# Patient Record
Sex: Female | Born: 1975
Health system: Southern US, Community
[De-identification: ages and names within clinical notes are randomized; demographics above are authoritative.]

## PROBLEM LIST (undated history)

## (undated) DIAGNOSIS — IMO0002 Reserved for concepts with insufficient information to code with codable children: Secondary | ICD-10-CM

## (undated) DIAGNOSIS — G43909 Migraine, unspecified, not intractable, without status migrainosus: Secondary | ICD-10-CM

## (undated) DIAGNOSIS — R51 Headache: Secondary | ICD-10-CM

## (undated) DIAGNOSIS — G479 Sleep disorder, unspecified: Secondary | ICD-10-CM

## (undated) HISTORY — DX: Reserved for concepts with insufficient information to code with codable children: IMO0002

## (undated) HISTORY — DX: Migraine, unspecified, not intractable, without status migrainosus: G43.909

## (undated) HISTORY — PX: DILATION AND CURETTAGE OF UTERUS: SHX78

## (undated) HISTORY — PX: LIPOSUCTION: SHX10

---

## 1998-01-28 ENCOUNTER — Inpatient Hospital Stay (HOSPITAL_COMMUNITY): Admission: AD | Admit: 1998-01-28 | Discharge: 1998-01-28 | Payer: Self-pay | Admitting: Obstetrics and Gynecology

## 1998-01-31 ENCOUNTER — Inpatient Hospital Stay (HOSPITAL_COMMUNITY): Admission: AD | Admit: 1998-01-31 | Discharge: 1998-02-03 | Payer: Self-pay | Admitting: Obstetrics and Gynecology

## 2002-11-13 DIAGNOSIS — IMO0002 Reserved for concepts with insufficient information to code with codable children: Secondary | ICD-10-CM

## 2002-11-13 DIAGNOSIS — R87619 Unspecified abnormal cytological findings in specimens from cervix uteri: Secondary | ICD-10-CM

## 2002-11-13 HISTORY — DX: Unspecified abnormal cytological findings in specimens from cervix uteri: R87.619

## 2002-11-13 HISTORY — DX: Reserved for concepts with insufficient information to code with codable children: IMO0002

## 2002-11-13 HISTORY — PX: CERVICAL BIOPSY  W/ LOOP ELECTRODE EXCISION: SUR135

## 2006-07-26 ENCOUNTER — Ambulatory Visit: Payer: Self-pay | Admitting: Family Medicine

## 2006-12-20 ENCOUNTER — Ambulatory Visit: Payer: Self-pay | Admitting: Family Medicine

## 2008-02-11 ENCOUNTER — Other Ambulatory Visit: Admission: RE | Admit: 2008-02-11 | Discharge: 2008-02-11 | Payer: Self-pay | Admitting: Obstetrics and Gynecology

## 2009-02-11 ENCOUNTER — Other Ambulatory Visit: Admission: RE | Admit: 2009-02-11 | Discharge: 2009-02-11 | Payer: Self-pay | Admitting: Obstetrics & Gynecology

## 2011-10-18 ENCOUNTER — Inpatient Hospital Stay (HOSPITAL_COMMUNITY)
Admission: AD | Admit: 2011-10-18 | Discharge: 2011-10-19 | Disposition: A | Discharge status: Home / Self Care | Payer: BC Managed Care – PPO | Source: Ambulatory Visit | Attending: Obstetrics and Gynecology | Admitting: Obstetrics and Gynecology

## 2011-10-18 DIAGNOSIS — M549 Dorsalgia, unspecified: Secondary | ICD-10-CM | POA: Insufficient documentation

## 2011-10-18 DIAGNOSIS — R1013 Epigastric pain: Secondary | ICD-10-CM | POA: Insufficient documentation

## 2011-10-18 DIAGNOSIS — R109 Unspecified abdominal pain: Secondary | ICD-10-CM

## 2011-10-18 DIAGNOSIS — D72829 Elevated white blood cell count, unspecified: Secondary | ICD-10-CM | POA: Insufficient documentation

## 2011-10-18 LAB — URINALYSIS, ROUTINE W REFLEX MICROSCOPIC
Glucose, UA: NEGATIVE mg/dL
Ketones, ur: 15 mg/dL — AB
Leukocytes, UA: NEGATIVE
Protein, ur: NEGATIVE mg/dL
Urobilinogen, UA: 0.2 mg/dL (ref 0.0–1.0)

## 2011-10-18 LAB — DIFFERENTIAL
Basophils Absolute: 0 10*3/uL (ref 0.0–0.1)
Basophils Relative: 0 % (ref 0–1)
Eosinophils Relative: 0 % (ref 0–5)
Monocytes Absolute: 0.5 10*3/uL (ref 0.1–1.0)
Neutro Abs: 12.8 10*3/uL — ABNORMAL HIGH (ref 1.7–7.7)

## 2011-10-18 LAB — CBC
HCT: 36.1 % (ref 36.0–46.0)
MCHC: 33.5 g/dL (ref 30.0–36.0)
MCV: 86.2 fL (ref 78.0–100.0)
RDW: 13.8 % (ref 11.5–15.5)

## 2011-10-18 MED ORDER — GI COCKTAIL ~~LOC~~
30.0000 mL | Freq: Once | ORAL | Status: AC
  Administered 2011-10-18: 30 mL via ORAL
  Filled 2011-10-18: qty 30

## 2011-10-18 MED ORDER — KETOROLAC TROMETHAMINE 30 MG/ML IJ SOLN
30.0000 mg | Freq: Once | INTRAMUSCULAR | Status: AC
  Administered 2011-10-18: 30 mg via INTRAMUSCULAR
  Filled 2011-10-18: qty 1

## 2011-10-18 NOTE — Progress Notes (Signed)
Pt reports "severe stomach and back pain" since last pm, constant. Denies dysuria. LMP  11/15. Nausea but no vomiting, denies fever.

## 2011-10-18 NOTE — ED Provider Notes (Signed)
Phyllis Morales y.o.No obstetric history on file. @Unknown  Chief Complaint  Patient presents with  . Back Pain    SUBJECTIVE  HPI: Onset last evening about 2 hours after eating of sharp epigastric pain radiating to mid back. The pain kept her up all night. Last ate at 11:30 AM and began having severe abdominal pain throughout her abdomen about 2 hours after eating. She can't localize the pain but describes it as sharp and constant and is radiating to her back. She has tried Advil with no relief. She's had some nausea but no vomiting. Denies fever, diarrhea, constipation. No particular food intolerances in the past.  No past medical history on file. No past surgical history on file. History   Social History  . Marital Status: Married    Spouse Name: N/A    Number of Children: N/A  . Years of Education: N/A   Occupational History  . Not on file.   Social History Main Topics  . Smoking status: Not on file  . Smokeless tobacco: Not on file  . Alcohol Use: Not on file  . Drug Use: Not on file  . Sexually Active: Not on file   Other Topics Concern  . Not on file   Social History Narrative  . No narrative on file   No current facility-administered medications on file prior to encounter.   No current outpatient prescriptions on file prior to encounter.   No Known Allergies  Review of Systems  Constitutional: Negative for fever, chills and diaphoresis.  Respiratory: Negative for cough.   Cardiovascular: Negative for chest pain and palpitations.  Gastrointestinal: Positive for nausea and abdominal pain. Negative for vomiting, diarrhea and constipation.  Genitourinary: Negative for dysuria, urgency, frequency, hematuria and flank pain.  Musculoskeletal: Positive for back pain.  Neurological: Negative for weakness and headaches.     OBJECTIVE  BP 126/75  Pulse 53  Temp 98.9 F (37.2 C)  Resp 18  Ht 5' (1.524 m)  Wt 63.504 kg (140 lb)  BMI 27.34 kg/m2  SpO2 99%   LMP 09/28/2011  Physical Exam  Constitutional: She is oriented to person, place, and time. She appears distressed.       Apparent pain  HENT:  Head: Normocephalic.  Neck: Neck supple.  Cardiovascular: Normal rate and regular rhythm.   Pulmonary/Chest: Effort normal and breath sounds normal. She has no wheezes.  Abdominal: Soft. Bowel sounds are normal. She exhibits no distension. There is tenderness. There is no rebound and no guarding.       Bowel sounds active. Murphy sign equivocal. No localized tenderness at McBurney's point. Abdomen is diffusely tender but tenderness is mild to moderate  Musculoskeletal: Normal range of motion.  Neurological: She is alert and oriented to person, place, and time.  Skin: Skin is warm and dry.  Psychiatric: Affect normal.   Results for orders placed during the hospital encounter of 10/18/11 (from the past 24 hour(s))  URINALYSIS, ROUTINE W REFLEX MICROSCOPIC     Status: Abnormal   Collection Time   10/18/11  8:50 PM      Component Value Range   Color, Urine YELLOW  YELLOW    APPearance CLEAR  CLEAR    Specific Gravity, Urine 1.020  1.005 - 1.030    pH 6.0  5.0 - 8.0    Glucose, UA NEGATIVE  NEGATIVE (mg/dL)   Hgb urine dipstick NEGATIVE  NEGATIVE    Bilirubin Urine NEGATIVE  NEGATIVE    Ketones, ur 15 (*)  NEGATIVE (mg/dL)   Protein, ur NEGATIVE  NEGATIVE (mg/dL)   Urobilinogen, UA 0.2  0.0 - 1.0 (mg/dL)   Nitrite NEGATIVE  NEGATIVE    Leukocytes, UA NEGATIVE  NEGATIVE   CBC     Status: Abnormal   Collection Time   10/18/11 10:37 PM      Component Value Range   WBC 14.5 (*) 4.0 - 10.5 (K/uL)   RBC 4.19  3.87 - 5.11 (MIL/uL)   Hemoglobin 12.1  12.0 - 15.0 (g/dL)   HCT 16.1  09.6 - 04.5 (%)   MCV 86.2  78.0 - 100.0 (fL)   MCH 28.9  26.0 - 34.0 (pg)   MCHC 33.5  30.0 - 36.0 (g/dL)   RDW 40.9  81.1 - 91.4 (%)   Platelets 236  150 - 400 (K/uL)  DIFFERENTIAL     Status: Abnormal   Collection Time   10/18/11 10:37 PM      Component Value  Range   Neutrophils Relative 89 (*) 43 - 77 (%)   Neutro Abs 12.8 (*) 1.7 - 7.7 (K/uL)   Lymphocytes Relative 7 (*) 12 - 46 (%)   Lymphs Abs 1.0  0.7 - 4.0 (K/uL)   Monocytes Relative 4  3 - 12 (%)   Monocytes Absolute 0.5  0.1 - 1.0 (K/uL)   Eosinophils Relative 0  0 - 5 (%)   Eosinophils Absolute 0.0  0.0 - 0.7 (K/uL)   Basophils Relative 0  0 - 1 (%)   Basophils Absolute 0.0  0.0 - 0.1 (K/uL)  AMYLASE     Status: Normal   Collection Time   10/18/11 10:37 PM      Component Value Range   Amylase 86  0 - 105 (U/L)  LIPASE, BLOOD     Status: Normal   Collection Time   10/18/11 10:37 PM      Component Value Range   Lipase 23  11 - 59 (U/L)   MAU Course: after the Toradol shot her pain went from 9/10 to 3/10. Then a GI cocktail had no effect. Before drinking that she vomited once CMP pending   MDM: suspect esophageal spasm due to relationship to eating, however with elevated white count, left shift and nausea/vomiting cannot yet rule out appendicitis or cholecystitis  ASSESSMENT  Abdominal pain and leukocytosis.   PLAN Patient elected to leave due to childcare issue before lab results were back. She will followup with Dr. Prince Rome tomorrow.  Consulted Dr. Despina Hidden

## 2011-10-19 ENCOUNTER — Other Ambulatory Visit: Payer: Self-pay | Admitting: Family Medicine

## 2011-10-19 LAB — COMPREHENSIVE METABOLIC PANEL
BUN: 6 mg/dL (ref 6–23)
Calcium: 9.5 mg/dL (ref 8.4–10.5)
GFR calc Af Amer: >90 mL/min (ref >90)
Glucose, Bld: 107 mg/dL — ABNORMAL HIGH (ref 70–99)
Total Protein: 7.2 g/dL (ref 6.0–8.3)

## 2011-10-19 NOTE — Progress Notes (Signed)
Pt deferred v/s because she desires to leave now.  Pt agitated because of long wait.

## 2011-10-23 ENCOUNTER — Ambulatory Visit
Admission: RE | Admit: 2011-10-23 | Discharge: 2011-10-23 | Disposition: A | Discharge status: Home / Self Care | Payer: BC Managed Care – PPO | Source: Ambulatory Visit | Attending: Family Medicine | Admitting: Family Medicine

## 2011-11-10 ENCOUNTER — Other Ambulatory Visit: Payer: Self-pay | Admitting: Family Medicine

## 2011-11-13 ENCOUNTER — Ambulatory Visit
Admission: RE | Admit: 2011-11-13 | Discharge: 2011-11-13 | Disposition: A | Discharge status: Home / Self Care | Payer: BC Managed Care – PPO | Source: Ambulatory Visit | Attending: Family Medicine | Admitting: Family Medicine

## 2011-11-13 MED ORDER — IOHEXOL 300 MG/ML  SOLN
100.0000 mL | Freq: Once | INTRAMUSCULAR | Status: AC | PRN
  Administered 2011-11-13: 100 mL via INTRAVENOUS

## 2012-04-17 ENCOUNTER — Ambulatory Visit (HOSPITAL_COMMUNITY)
Admission: EM | Admit: 2012-04-17 | Discharge: 2012-04-18 | Disposition: A | Discharge status: Home / Self Care | Payer: BC Managed Care – PPO | Attending: General Surgery | Admitting: General Surgery

## 2012-04-17 ENCOUNTER — Encounter (HOSPITAL_COMMUNITY): Payer: Self-pay

## 2012-04-17 ENCOUNTER — Encounter (HOSPITAL_COMMUNITY): Admission: EM | Disposition: A | Discharge status: Home / Self Care | Payer: Self-pay | Source: Home / Self Care

## 2012-04-17 ENCOUNTER — Inpatient Hospital Stay (HOSPITAL_COMMUNITY): Payer: BC Managed Care – PPO | Admitting: Anesthesiology

## 2012-04-17 ENCOUNTER — Inpatient Hospital Stay (HOSPITAL_COMMUNITY): Payer: BC Managed Care – PPO

## 2012-04-17 ENCOUNTER — Encounter (HOSPITAL_COMMUNITY): Payer: Self-pay | Admitting: General Practice

## 2012-04-17 ENCOUNTER — Encounter (HOSPITAL_COMMUNITY): Payer: Self-pay | Admitting: Anesthesiology

## 2012-04-17 ENCOUNTER — Emergency Department (HOSPITAL_COMMUNITY): Payer: BC Managed Care – PPO

## 2012-04-17 DIAGNOSIS — K8010 Calculus of gallbladder with chronic cholecystitis without obstruction: Principal | ICD-10-CM | POA: Insufficient documentation

## 2012-04-17 DIAGNOSIS — K8020 Calculus of gallbladder without cholecystitis without obstruction: Secondary | ICD-10-CM

## 2012-04-17 DIAGNOSIS — R51 Headache: Secondary | ICD-10-CM

## 2012-04-17 HISTORY — DX: Headache: R51

## 2012-04-17 HISTORY — PX: CHOLECYSTECTOMY: SHX55

## 2012-04-17 HISTORY — DX: Sleep disorder, unspecified: G47.9

## 2012-04-17 LAB — COMPREHENSIVE METABOLIC PANEL
ALT: 10 U/L (ref 0–35)
AST: 14 U/L (ref 0–37)
Albumin: 3.5 g/dL (ref 3.5–5.2)
Alkaline Phosphatase: 42 U/L (ref 39–117)
BUN: 10 mg/dL (ref 6–23)
CO2: 22 mEq/L (ref 19–32)
Calcium: 9 mg/dL (ref 8.4–10.5)
Chloride: 101 mEq/L (ref 96–112)
Creatinine, Ser: 0.71 mg/dL (ref 0.50–1.10)
GFR calc Af Amer: >90 mL/min (ref >90)
GFR calc non Af Amer: >90 mL/min (ref >90)
Glucose, Bld: 145 mg/dL — ABNORMAL HIGH (ref 70–99)
Potassium: 3.7 mEq/L (ref 3.5–5.1)
Sodium: 134 mEq/L — ABNORMAL LOW (ref 135–145)
Total Bilirubin: 0.3 mg/dL (ref 0.3–1.2)
Total Protein: 7.1 g/dL (ref 6.0–8.3)

## 2012-04-17 LAB — CBC
HCT: 37.1 % (ref 36.0–46.0)
Hemoglobin: 13.1 g/dL (ref 12.0–15.0)
MCH: 30.9 pg (ref 26.0–34.0)
MCHC: 35.3 g/dL (ref 30.0–36.0)
MCV: 87.5 fL (ref 78.0–100.0)
Platelets: 263 10*3/uL (ref 150–400)
RBC: 4.24 MIL/uL (ref 3.87–5.11)
RDW: 13.4 % (ref 11.5–15.5)
WBC: 13.5 10*3/uL — ABNORMAL HIGH (ref 4.0–10.5)

## 2012-04-17 LAB — PREGNANCY, URINE: Preg Test, Ur: NEGATIVE

## 2012-04-17 LAB — URINALYSIS, ROUTINE W REFLEX MICROSCOPIC
Bilirubin Urine: NEGATIVE
Glucose, UA: NEGATIVE mg/dL
Hgb urine dipstick: NEGATIVE
Ketones, ur: 15 mg/dL — AB
Leukocytes, UA: NEGATIVE
Nitrite: NEGATIVE
Protein, ur: NEGATIVE mg/dL
Specific Gravity, Urine: 1.022 (ref 1.005–1.030)
Urobilinogen, UA: 0.2 mg/dL (ref 0.0–1.0)
pH: 7.5 (ref 5.0–8.0)

## 2012-04-17 LAB — DIFFERENTIAL
Basophils Absolute: 0 10*3/uL (ref 0.0–0.1)
Basophils Relative: 0 % (ref 0–1)
Eosinophils Absolute: 0 10*3/uL (ref 0.0–0.7)
Eosinophils Relative: 0 % (ref 0–5)
Lymphocytes Relative: 7 % — ABNORMAL LOW (ref 12–46)
Lymphs Abs: 1 10*3/uL (ref 0.7–4.0)
Monocytes Absolute: 0.3 10*3/uL (ref 0.1–1.0)
Monocytes Relative: 3 % (ref 3–12)
Neutro Abs: 12.2 10*3/uL — ABNORMAL HIGH (ref 1.7–7.7)
Neutrophils Relative %: 90 % — ABNORMAL HIGH (ref 43–77)

## 2012-04-17 LAB — LIPASE, BLOOD: Lipase: 24 U/L (ref 11–59)

## 2012-04-17 SURGERY — LAPAROSCOPIC CHOLECYSTECTOMY WITH INTRAOPERATIVE CHOLANGIOGRAM
Anesthesia: General | Site: Abdomen | Wound class: Contaminated

## 2012-04-17 MED ORDER — ONDANSETRON HCL 4 MG/2ML IJ SOLN
INTRAMUSCULAR | Status: AC
  Filled 2012-04-17: qty 2

## 2012-04-17 MED ORDER — AMPICILLIN-SULBACTAM SODIUM 3 (2-1) G IJ SOLR
3.0000 g | Freq: Once | INTRAMUSCULAR | Status: AC
  Administered 2012-04-17 (×2): 3 g via INTRAVENOUS
  Filled 2012-04-17: qty 3

## 2012-04-17 MED ORDER — PIPERACILLIN-TAZOBACTAM 3.375 G IVPB 30 MIN
3.3750 g | Freq: Three times a day (TID) | INTRAVENOUS | Status: AC
  Administered 2012-04-17: 3.375 g via INTRAVENOUS
  Filled 2012-04-17: qty 50

## 2012-04-17 MED ORDER — HYDROMORPHONE HCL PF 1 MG/ML IJ SOLN
1.0000 mg | Freq: Once | INTRAMUSCULAR | Status: AC
  Administered 2012-04-17: 1 mg via INTRAVENOUS
  Filled 2012-04-17: qty 1

## 2012-04-17 MED ORDER — FENTANYL CITRATE 0.05 MG/ML IJ SOLN
INTRAMUSCULAR | Status: DC | PRN
  Administered 2012-04-17 (×2): 50 ug via INTRAVENOUS

## 2012-04-17 MED ORDER — POTASSIUM CHLORIDE IN NACL 20-0.9 MEQ/L-% IV SOLN
INTRAVENOUS | Status: DC
  Administered 2012-04-17: 1000 mL via INTRAVENOUS
  Administered 2012-04-18: 02:00:00 via INTRAVENOUS
  Filled 2012-04-17 (×4): qty 1000

## 2012-04-17 MED ORDER — HYDROCODONE-ACETAMINOPHEN 5-325 MG PO TABS
1.0000 | ORAL_TABLET | ORAL | Status: DC | PRN
  Administered 2012-04-17: 2 via ORAL
  Administered 2012-04-18: 1 via ORAL
  Administered 2012-04-18: 2 via ORAL
  Filled 2012-04-17 (×3): qty 2

## 2012-04-17 MED ORDER — POTASSIUM CHLORIDE IN NACL 20-0.9 MEQ/L-% IV SOLN: INTRAVENOUS | Status: DC

## 2012-04-17 MED ORDER — MORPHINE SULFATE 2 MG/ML IJ SOLN: 2.0000 mg | INTRAMUSCULAR | Status: DC | PRN

## 2012-04-17 MED ORDER — BUPIVACAINE-EPINEPHRINE 0.25% -1:200000 IJ SOLN
INTRAMUSCULAR | Status: DC | PRN
  Administered 2012-04-17: 8 mL

## 2012-04-17 MED ORDER — ACETAMINOPHEN 10 MG/ML IV SOLN
INTRAVENOUS | Status: DC | PRN
  Administered 2012-04-17: 1000 mg via INTRAVENOUS

## 2012-04-17 MED ORDER — SUCCINYLCHOLINE CHLORIDE 20 MG/ML IJ SOLN
INTRAMUSCULAR | Status: DC | PRN
  Administered 2012-04-17: 100 mg via INTRAVENOUS

## 2012-04-17 MED ORDER — ROCURONIUM BROMIDE 100 MG/10ML IV SOLN
INTRAVENOUS | Status: DC | PRN
  Administered 2012-04-17: 30 mg via INTRAVENOUS

## 2012-04-17 MED ORDER — MORPHINE SULFATE 2 MG/ML IJ SOLN
1.0000 mg | INTRAMUSCULAR | Status: DC | PRN
  Administered 2012-04-17 (×2): 4 mg via INTRAVENOUS
  Filled 2012-04-17 (×2): qty 2

## 2012-04-17 MED ORDER — 0.9 % SODIUM CHLORIDE (POUR BTL) OPTIME
TOPICAL | Status: DC | PRN
  Administered 2012-04-17: 1000 mL

## 2012-04-17 MED ORDER — ONDANSETRON HCL 4 MG/2ML IJ SOLN
4.0000 mg | Freq: Four times a day (QID) | INTRAMUSCULAR | Status: DC | PRN
  Administered 2012-04-17 (×2): 4 mg via INTRAVENOUS
  Filled 2012-04-17 (×2): qty 2

## 2012-04-17 MED ORDER — PROPOFOL 10 MG/ML IV EMUL
INTRAVENOUS | Status: DC | PRN
  Administered 2012-04-17: 140 mg via INTRAVENOUS

## 2012-04-17 MED ORDER — HYDROMORPHONE HCL PF 1 MG/ML IJ SOLN
0.2500 mg | INTRAMUSCULAR | Status: DC | PRN
  Administered 2012-04-17 (×2): 0.5 mg via INTRAVENOUS

## 2012-04-17 MED ORDER — HYDROMORPHONE HCL PF 1 MG/ML IJ SOLN
INTRAMUSCULAR | Status: AC
  Filled 2012-04-17: qty 1

## 2012-04-17 MED ORDER — SODIUM CHLORIDE 0.9 % IV SOLN
INTRAVENOUS | Status: DC | PRN
  Administered 2012-04-17: 14:00:00

## 2012-04-17 MED ORDER — ONDANSETRON HCL 4 MG/2ML IJ SOLN: 4.0000 mg | Freq: Four times a day (QID) | INTRAMUSCULAR | Status: DC | PRN

## 2012-04-17 MED ORDER — NEOSTIGMINE METHYLSULFATE 1 MG/ML IJ SOLN
INTRAMUSCULAR | Status: DC | PRN
  Administered 2012-04-17: 3 mg via INTRAVENOUS

## 2012-04-17 MED ORDER — SODIUM CHLORIDE 0.9 % IV BOLUS (SEPSIS)
1000.0000 mL | Freq: Once | INTRAVENOUS | Status: AC
  Administered 2012-04-17: 1000 mL via INTRAVENOUS

## 2012-04-17 MED ORDER — MIDAZOLAM HCL 5 MG/5ML IJ SOLN
INTRAMUSCULAR | Status: DC | PRN
  Administered 2012-04-17: 1 mg via INTRAVENOUS

## 2012-04-17 MED ORDER — ONDANSETRON HCL 4 MG/2ML IJ SOLN: 4.0000 mg | Freq: Once | INTRAMUSCULAR | Status: DC | PRN

## 2012-04-17 MED ORDER — DIPHENHYDRAMINE HCL 12.5 MG/5ML PO ELIX
12.5000 mg | ORAL_SOLUTION | Freq: Four times a day (QID) | ORAL | Status: DC | PRN
  Filled 2012-04-17: qty 10

## 2012-04-17 MED ORDER — GLYCOPYRROLATE 0.2 MG/ML IJ SOLN
INTRAMUSCULAR | Status: DC | PRN
  Administered 2012-04-17: 0.4 mg via INTRAVENOUS

## 2012-04-17 MED ORDER — LIDOCAINE HCL (CARDIAC) 20 MG/ML IV SOLN
INTRAVENOUS | Status: DC | PRN
  Administered 2012-04-17: 30 mg via INTRAVENOUS

## 2012-04-17 MED ORDER — HEPARIN SODIUM (PORCINE) 5000 UNIT/ML IJ SOLN
5000.0000 [IU] | Freq: Three times a day (TID) | INTRAMUSCULAR | Status: DC
  Administered 2012-04-17 – 2012-04-18 (×2): 5000 [IU] via SUBCUTANEOUS
  Filled 2012-04-17 (×5): qty 1

## 2012-04-17 MED ORDER — ONDANSETRON HCL 4 MG/2ML IJ SOLN
INTRAMUSCULAR | Status: DC | PRN
  Administered 2012-04-17: 4 mg via INTRAVENOUS

## 2012-04-17 MED ORDER — HYDROMORPHONE HCL PF 1 MG/ML IJ SOLN
1.0000 mg | Freq: Once | INTRAMUSCULAR | Status: AC
  Administered 2012-04-17: 1 mg via INTRAMUSCULAR
  Filled 2012-04-17: qty 1

## 2012-04-17 MED ORDER — SODIUM CHLORIDE 0.9 % IR SOLN
Status: DC | PRN
  Administered 2012-04-17: 1000 mL

## 2012-04-17 MED ORDER — SODIUM CHLORIDE 0.9 % IV SOLN
INTRAVENOUS | Status: DC | PRN
  Administered 2012-04-17: 13:00:00 via INTRAVENOUS

## 2012-04-17 MED ORDER — PROMETHAZINE HCL 25 MG/ML IJ SOLN: 12.5000 mg | Freq: Four times a day (QID) | INTRAMUSCULAR | Status: DC | PRN

## 2012-04-17 MED ORDER — HYDROMORPHONE HCL PF 1 MG/ML IJ SOLN: 0.5000 mg | INTRAMUSCULAR | Status: DC | PRN

## 2012-04-17 MED ORDER — ONDANSETRON HCL 4 MG PO TABS
4.0000 mg | ORAL_TABLET | Freq: Four times a day (QID) | ORAL | Status: DC | PRN
  Administered 2012-04-18: 4 mg via ORAL
  Filled 2012-04-17: qty 1

## 2012-04-17 MED ORDER — ONDANSETRON HCL 4 MG/2ML IJ SOLN
4.0000 mg | Freq: Once | INTRAMUSCULAR | Status: AC
  Administered 2012-04-17: 4 mg via INTRAVENOUS
  Filled 2012-04-17: qty 2

## 2012-04-17 MED ORDER — DIPHENHYDRAMINE HCL 50 MG/ML IJ SOLN: 12.5000 mg | Freq: Four times a day (QID) | INTRAMUSCULAR | Status: DC | PRN

## 2012-04-17 SURGICAL SUPPLY — 45 items
ADH SKN CLS APL DERMABOND .7 (GAUZE/BANDAGES/DRESSINGS) ×1
APPLIER CLIP ROT 10 11.4 M/L (STAPLE) ×2
APR CLP MED LRG 11.4X10 (STAPLE) ×1
BAG SPEC RTRVL LRG 6X4 10 (ENDOMECHANICALS) ×1
BLADE SURG ROTATE 9660 (MISCELLANEOUS) IMPLANT
CANISTER SUCTION 2500CC (MISCELLANEOUS) ×2 IMPLANT
CHLORAPREP W/TINT 26ML (MISCELLANEOUS) ×2 IMPLANT
CLIP APPLIE ROT 10 11.4 M/L (STAPLE) ×1 IMPLANT
CLOTH BEACON ORANGE TIMEOUT ST (SAFETY) ×2 IMPLANT
COVER MAYO STAND STRL (DRAPES) ×2 IMPLANT
COVER SURGICAL LIGHT HANDLE (MISCELLANEOUS) ×2 IMPLANT
DECANTER SPIKE VIAL GLASS SM (MISCELLANEOUS) ×4 IMPLANT
DERMABOND ADVANCED (GAUZE/BANDAGES/DRESSINGS) ×1
DERMABOND ADVANCED .7 DNX12 (GAUZE/BANDAGES/DRESSINGS) ×1 IMPLANT
DRAPE C-ARM 42X72 X-RAY (DRAPES) ×2 IMPLANT
DRAPE UTILITY 15X26 W/TAPE STR (DRAPE) ×4 IMPLANT
ELECT REM PT RETURN 9FT ADLT (ELECTROSURGICAL) ×2
ELECTRODE REM PT RTRN 9FT ADLT (ELECTROSURGICAL) ×1 IMPLANT
GLOVE BIO SURGEON STRL SZ7 (GLOVE) ×2 IMPLANT
GLOVE BIO SURGEON STRL SZ7.5 (GLOVE) ×2 IMPLANT
GLOVE BIOGEL PI IND STRL 7.0 (GLOVE) ×2 IMPLANT
GLOVE BIOGEL PI IND STRL 7.5 (GLOVE) ×2 IMPLANT
GLOVE BIOGEL PI INDICATOR 7.0 (GLOVE) ×2
GLOVE BIOGEL PI INDICATOR 7.5 (GLOVE) ×2
GLOVE ECLIPSE 7.0 STRL STRAW (GLOVE) ×2 IMPLANT
GLOVE EUDERMIC 7 POWDERFREE (GLOVE) ×2 IMPLANT
GOWN PREVENTION PLUS XLARGE (GOWN DISPOSABLE) ×2 IMPLANT
GOWN STRL NON-REIN LRG LVL3 (GOWN DISPOSABLE) ×6 IMPLANT
KIT BASIN OR (CUSTOM PROCEDURE TRAY) ×2 IMPLANT
KIT ROOM TURNOVER OR (KITS) ×2 IMPLANT
NS IRRIG 1000ML POUR BTL (IV SOLUTION) ×2 IMPLANT
PAD ARMBOARD 7.5X6 YLW CONV (MISCELLANEOUS) ×2 IMPLANT
POUCH SPECIMEN RETRIEVAL 10MM (ENDOMECHANICALS) ×2 IMPLANT
SCISSORS LAP 5X35 DISP (ENDOMECHANICALS) IMPLANT
SET CHOLANGIOGRAPH 5 50 .035 (SET/KITS/TRAYS/PACK) ×2 IMPLANT
SET IRRIG TUBING LAPAROSCOPIC (IRRIGATION / IRRIGATOR) ×2 IMPLANT
SLEEVE ENDOPATH XCEL 5M (ENDOMECHANICALS) ×2 IMPLANT
SPECIMEN JAR SMALL (MISCELLANEOUS) ×2 IMPLANT
SUT MNCRL AB 4-0 PS2 18 (SUTURE) ×2 IMPLANT
TOWEL OR 17X24 6PK STRL BLUE (TOWEL DISPOSABLE) ×2 IMPLANT
TOWEL OR 17X26 10 PK STRL BLUE (TOWEL DISPOSABLE) ×2 IMPLANT
TRAY LAPAROSCOPIC (CUSTOM PROCEDURE TRAY) ×2 IMPLANT
TROCAR XCEL BLUNT TIP 100MML (ENDOMECHANICALS) ×2 IMPLANT
TROCAR XCEL NON-BLD 11X100MML (ENDOMECHANICALS) ×2 IMPLANT
TROCAR XCEL NON-BLD 5MMX100MML (ENDOMECHANICALS) ×2 IMPLANT

## 2012-04-17 NOTE — Anesthesia Postprocedure Evaluation (Signed)
Anesthesia Post Note  Patient: [name]  Procedure(s) Performed: Procedure(s) (LRB): LAPAROSCOPIC CHOLECYSTECTOMY WITH INTRAOPERATIVE CHOLANGIOGRAM (N/A)  Anesthesia type: general  Patient location: PACU  Post pain: Pain level controlled  Post assessment: Patient's Cardiovascular Status Stable  Last Vitals:  Filed Vitals:   04/17/12 1445  BP: 105/63  Pulse:   Temp: 36.1 C  Resp:     Post vital signs: Reviewed and stable  Level of consciousness: sedated  Complications: No apparent anesthesia complications

## 2012-04-17 NOTE — Transfer of Care (Signed)
Immediate Anesthesia Transfer of Care Note  Patient: Phyllis Morales  Procedure(s) Performed: Procedure(s) (LRB): LAPAROSCOPIC CHOLECYSTECTOMY WITH INTRAOPERATIVE CHOLANGIOGRAM (N/A)  Patient Location: PACU  Anesthesia Type: General  Level of Consciousness: awake, alert  and oriented  Airway & Oxygen Therapy: Patient Spontanous Breathing  Post-op Assessment: Report given to PACU RN and Post -op Vital signs reviewed and stable  Post vital signs: Reviewed and stable  Complications: No apparent anesthesia complications

## 2012-04-17 NOTE — ED Notes (Signed)
Pt presented to the ER with complaint of abdominal burning radiating to the back with nausea, vomiting. Pt denies any burning with urination.

## 2012-04-17 NOTE — H&P (Signed)
I have examined the patient and interviewed the patient. I agree with the history, physical exam, and treatment plan as outlined above.  We're going to try to proceed with her cholecystectomy either today or tomorrow morning.   Angelia Mould. Derrell Lolling, M.D., [facility] General and Minimally invasive Surgery Breast and Colorectal Surgery Office:   [phone] Pager:   [phone]

## 2012-04-17 NOTE — ED Provider Notes (Signed)
History     CSN: [id number]  Arrival date & time 04/17/12  0715   First MD Initiated Contact with Patient 04/17/12 0720      Chief Complaint  Patient presents with  . Abdominal Pain    (Consider location/radiation/quality/duration/timing/severity/associated sxs/prior treatment) HPI Patient presents emergency Dept. with right upper abdominal pain that began 3 days ago.  She states it was intermittent until last night when it became constant and she started with nausea and vomiting.  Patient states that she had a CT scan done several months ago, which showed a gallstone.  Patient denies fevers, chest pain, shortness of breath, dizziness, or weakness.  Patient, states that her pain does radiate around to her back as well.  History reviewed. No pertinent past medical history.  History reviewed. No pertinent past surgical history.  No family history on file.  History  Substance Use Topics  . Smoking status: Never Smoker   . Smokeless tobacco: Not on file  . Alcohol Use: No    OB History    Grav Para Term Preterm Abortions TAB SAB Ect Mult Living                  Review of Systems All other systems negative except as documented in the HPI. All pertinent positives and negatives as reviewed in the HPI.  Allergies  Review of patient's allergies indicates no known allergies.  Home Medications   Current Outpatient Rx  Name Route Sig Dispense Refill  . IBUPROFEN 200 MG PO TABS Oral Take 200-800 mg by mouth every 6 (six) hours as needed. Headache or pain    . NORGESTIM-ETH ESTRAD TRIPHASIC 0.18/0.215/0.25 MG-35 MCG PO TABS Oral Take 1 tablet by mouth daily.      BP 101/63  Pulse 56  Resp 16  SpO2 95%  LMP 03/29/2012  Physical Exam  Constitutional: She is oriented to person, place, and time. She appears well-developed and well-nourished. No distress.  HENT:  Head: Normocephalic and atraumatic.  Mouth/Throat: Oropharynx is clear and moist.  Eyes: Pupils are equal, round,  and reactive to light.  Cardiovascular: Normal rate, regular rhythm and normal heart sounds.  Exam reveals no gallop and no friction rub.   No murmur heard. Pulmonary/Chest: Effort normal and breath sounds normal. No respiratory distress.  Abdominal: Soft. Normal appearance and bowel sounds are normal. There is tenderness in the right upper quadrant and epigastric area. There is no rigidity, no rebound and no guarding.    Neurological: She is alert and oriented to person, place, and time.  Skin: Skin is warm and dry. No rash noted.    ED Course  Procedures (including critical care time)  Labs Reviewed  COMPREHENSIVE METABOLIC PANEL - Abnormal; Notable for the following:    Sodium 134 (*)    Glucose, Bld 145 (*)    All other components within normal limits  CBC - Abnormal; Notable for the following:    WBC 13.5 (*)    All other components within normal limits  DIFFERENTIAL - Abnormal; Notable for the following:    Neutrophils Relative 90 (*)    Neutro Abs 12.2 (*)    Lymphocytes Relative 7 (*)    All other components within normal limits  URINALYSIS, ROUTINE W REFLEX MICROSCOPIC - Abnormal; Notable for the following:    Ketones, ur 15 (*)    All other components within normal limits  PREGNANCY, URINE  LIPASE, BLOOD   US Abdomen Complete  04/17/2012  *RADIOLOGY REPORT*  Clinical Data:  Right upper quadrant abdominal pain with nausea and vomiting.  COMPLETE ABDOMINAL ULTRASOUND  Comparison:  Abdominal pelvic CT 11/13/2011.  Findings:  Gallbladder: Again demonstrated is a gallstone within the gallbladder neck, measuring approximately 1.4 cm in diameter.  This could not be dislodged.  However, there is no gallbladder wall thickening, pericholecystic fluid or sonographic Murphy's sign.  Common bile duct:   Normal in caliber without filling defects.  Liver:  Echogenicity is within normal limits.  No focal hepatic abnormalities are identified.  IVC:  Visualized portions appear unremarkable.   Pancreas:  Visualized portions appear unremarkable.  Spleen:  Visualized portions appear unremarkable.  Right Kidney:   The renal cortical thickness and echogenicity are preserved.  There is no hydronephrosis or focal abnormality. Renal length is 10.8 cm.  Left Kidney:   The renal cortical thickness and echogenicity are preserved.  There is no hydronephrosis or focal abnormality. Renal length is 10.7 cm.  Abdominal aorta:  Visualized portions appear unremarkable.  IMPRESSION:  1.  Gallstone in the gallbladder neck.  No sonographic evidence of cholecystitis or biliary dilatation. 2.  Otherwise unremarkable examination.  Original Report Authenticated By: Gerrianne Scale, M.D.     Spoke with surgery that the patient, and they will come down for evaluation and admission to the hospital.  Patient stable at this time, although she is having recurrence of her pain and we will address that with further pain medication.   MDM  MDM Reviewed: vitals and nursing note Interpretation: labs and ultrasound Consults: general surgery            Carlyle Dolly, PA-C 04/17/12 1555

## 2012-04-17 NOTE — Progress Notes (Signed)
Pt c/o nausea/vomiting after giving Morphine for pain,Zofran given but still continues to have nausea,Md on call notified,new order received.

## 2012-04-17 NOTE — Progress Notes (Signed)
Surgery attending note:  I have personally interviewed and examined this patient. I reviewed her x-ray findings and laboratory findings and clinical history.  She most likely has low-grade acute cholecystitis with cholelithiasis. . This is a recurrent episode.  Plan is to admit her to the hospital now for IV hydration, intravenous antibiotics, analgesics, and anti-emetics. We will plan a laparoscopic cholecystectomy with cholangiogram, possible open within the next 12-24 hours, as the schedule will allow.  I discussed the indications, details, techniques, and numerous risks of laparoscopic cholecystectomy, including the possibility of conversion to open laparotomy. All of her questions are answered. She understands these issues. She agrees with this plan.   Angelia Mould. Derrell Lolling, M.D., [facility] General and Minimally invasive Surgery Breast and Colorectal Surgery Office:   [phone] Pager:   [phone]

## 2012-04-17 NOTE — Op Note (Signed)
Patient Name:           Phyllis Morales   Date of Surgery:        04/17/2012  Pre op Diagnosis:      acute cholecystitis with cholelithiasis  Post op Diagnosis:    same  Procedure:                 Laparoscopic cholecystectomy with cholangiogram  Surgeon:                     Angelia Mould. Derrell Lolling, M.D., FACS  Assistant:                      Emelia Loron, M.D., [facility]  Operative Indications:   [name] who presented with a several hour history of abdominal pain, epigastric, radiating to the back with associated nausea and vomiting. She presented to the emergency room where she was found to have epigastric abdominal tenderness, WBC 13,000, normal liver function tests, normal lipase, and an ultrasound showing gallstones. I felt that she was having low-grade acute cholecystitis. She wanted to come in the hospital and proceed with cholecystectomy and she is brought to the operating room for the surgical procedure.  Operative Findings:       The gallbladder was edematous and thickwalled, but there was no exudate or gangrene. She clearly had acute cholecystitis. The cholangiogram was normal showing normal intrahepatic and extrahepatic biliary anatomy, no ductal dilatation, no filling defects, and no obstruction showing good flow of contrast into the duodenum. The liver, duodenum, stomach, and large and small intestine were normal to inspection.  Procedure in Detail:          Following the induction of general endotracheal anesthesia the patient's abdomen was prepped and draped in a sterile fashion. Intravenous antibiotics had been given preop. Surgical time out was performed. 0.5% Marcaine with epinephrine was used as a local infiltration anesthetic. An 11 mm Hassan trocar was placed at the lower rim of the umbilicus with an open technique and secured with pursestring suture of 0 Vicryl. Pneumoperitoneum was created and the camera was inserted. A 5 mm trocar was placed in the  subxiphoid region and two 5 mm trocars placed in the right upper quadrant. The gallbladder was noted to be tense and edematous and was evacuated with a suction trocar. This facilitated mobilization. We dissected the peritoneum off of the neck of the gallbladder and identified the cystic artery and cystic duct. We isolated the cystic artery and secured it with multiple bone clips and divided. We created a large window behind the cystic duct. The cholangiogram catheter was inserted into the cystic duct and a cholangiogram was obtained using the C-arm. The cholangiogram was normal as described above. The cholangiogram catheter was removed, cystic duct was secured with multiple hemoclips and divided. The gallbladder was dissected with the electrocautery placed in a specimen bag and removed. The operative field was copiously irrigated. Irrigation fluid became clear there was no bleeding or bile leak. The trocars were removed. No bleeding from the trocar sites. Pneumoperitoneum released. The fascia at the umbilicus closed with 0 Vicryl sutures. Skin incisions closed with subcuticular stitches of 4-0 Monocryl and Dermabond. The patient was taken to recovery in stable condition. There were no complications. EBL 15 cc. Counts correct.     Angelia Mould. Derrell Lolling, M.D., FACS General and Minimally Invasive Surgery Breast and Colorectal Surgery  04/17/2012 2:43 PM

## 2012-04-17 NOTE — H&P (Signed)
AASHRITHA MIEDEMA 11-08-1976  [id number].   Requesting MD: Dr. Clarene Duke Chief Complaint/Reason for Consult: Gallstone in neck of Gallbladder HPI: Patient is a 36 year old female with 3 h/o abdominal pain with radiation to the back and nausea with vomiting.  She has had a similar episode about 5-6 months ago that was not as severe and resolved on its own.  Her current pain was generalized but is more epigastric and RUQ at present.  She has also had chills and fevers esp at night.  She denies diarrhea, urinary symptoms, chest pain, SOA, ent symptoms, joint pain, or stroke like symptoms.  Review of Systems: All systems are reviewed and found to be negative except as indicated in HPI  No family history on file.  History reviewed. No pertinent past medical history.  History reviewed. No pertinent past surgical history.  Social History:  reports that she has never smoked. She does not have any smokeless tobacco history on file. She reports that she does not drink alcohol or use illicit drugs.  Allergies: No Known Allergies   (Not in a hospital admission)  Blood pressure 101/63, pulse 56, resp. rate 16, last menstrual period 03/29/2012, SpO2 95.00%.  Physical Exam:  General:  WDWN in NAD.  Pleasant and cooperative.  HEENT:  NCAT, EOMI, no icterus.  NECK:  Supple, no obvious mass or thyroid enlargement.  CV:  RRR, no murmur, no JVD.  CHEST:  No scars.  BREASTS:  Symmetrical in size.    RESPIRATORY:  Breath sounds equal and clear. Respirations nonlabored.  ABDOMEN:  Soft, tender esp in the epigastric and RUQ, nondistended, no masses, no organomegaly, active bowel sounds, no scars, no hernias.  MUSCULOSKELETAL:  FROM, good muscle tone, no edema, no venous stasis changes  SKIN:  Warm and dry, No jaundice or suspicious rashes.  NEUROLOGIC:  Alert and oriented, answers questions appropriately, moves all four ext equally.    Results for orders placed during the hospital encounter of  04/17/12 (from the past 48 hour(s))  COMPREHENSIVE METABOLIC PANEL     Status: Abnormal   Collection Time   04/17/12  7:45 AM      Component Value Range Comment   Sodium 134 (*) 135 - 145 (mEq/L)    Potassium 3.7  3.5 - 5.1 (mEq/L)    Chloride 101  96 - 112 (mEq/L)    CO2 22  19 - 32 (mEq/L)    Glucose, Bld 145 (*) 70 - 99 (mg/dL)    BUN 10  6 - 23 (mg/dL)    Creatinine, Ser 6.21  0.50 - 1.10 (mg/dL)    Calcium 9.0  8.4 - 10.5 (mg/dL)    Total Protein 7.1  6.0 - 8.3 (g/dL)    Albumin 3.5  3.5 - 5.2 (g/dL)    AST 14  0 - 37 (U/L)    ALT 10  0 - 35 (U/L)    Alkaline Phosphatase 42  39 - 117 (U/L)    Total Bilirubin 0.3  0.3 - 1.2 (mg/dL)    GFR calc non Af Amer >90  >90 (mL/min)    GFR calc Af Amer >90  >90 (mL/min)   CBC     Status: Abnormal   Collection Time   04/17/12  7:45 AM      Component Value Range Comment   WBC 13.5 (*) 4.0 - 10.5 (K/uL)    RBC 4.24  3.87 - 5.11 (MIL/uL)    Hemoglobin 13.1  12.0 - 15.0 (g/dL)  HCT 37.1  36.0 - 46.0 (%)    MCV 87.5  78.0 - 100.0 (fL)    MCH 30.9  26.0 - 34.0 (pg)    MCHC 35.3  30.0 - 36.0 (g/dL)    RDW 29.5  28.4 - 13.2 (%)    Platelets 263  150 - 400 (K/uL)   DIFFERENTIAL     Status: Abnormal   Collection Time   04/17/12  7:45 AM      Component Value Range Comment   Neutrophils Relative 90 (*) 43 - 77 (%)    Neutro Abs 12.2 (*) 1.7 - 7.7 (K/uL)    Lymphocytes Relative 7 (*) 12 - 46 (%)    Lymphs Abs 1.0  0.7 - 4.0 (K/uL)    Monocytes Relative 3  3 - 12 (%)    Monocytes Absolute 0.3  0.1 - 1.0 (K/uL)    Eosinophils Relative 0  0 - 5 (%)    Eosinophils Absolute 0.0  0.0 - 0.7 (K/uL)    Basophils Relative 0  0 - 1 (%)    Basophils Absolute 0.0  0.0 - 0.1 (K/uL)   LIPASE, BLOOD     Status: Normal   Collection Time   04/17/12  7:45 AM      Component Value Range Comment   Lipase 24  11 - 59 (U/L)   URINALYSIS, ROUTINE W REFLEX MICROSCOPIC     Status: Abnormal   Collection Time   04/17/12  7:50 AM      Component Value Range Comment    Color, Urine YELLOW  YELLOW     APPearance CLEAR  CLEAR     Specific Gravity, Urine 1.022  1.005 - 1.030     pH 7.5  5.0 - 8.0     Glucose, UA NEGATIVE  NEGATIVE (mg/dL)    Hgb urine dipstick NEGATIVE  NEGATIVE     Bilirubin Urine NEGATIVE  NEGATIVE     Ketones, ur 15 (*) NEGATIVE (mg/dL)    Protein, ur NEGATIVE  NEGATIVE (mg/dL)    Urobilinogen, UA 0.2  0.0 - 1.0 (mg/dL)    Nitrite NEGATIVE  NEGATIVE     Leukocytes, UA NEGATIVE  NEGATIVE  MICROSCOPIC NOT DONE ON URINES WITH NEGATIVE PROTEIN, BLOOD, LEUKOCYTES, NITRITE, OR GLUCOSE <1000 mg/dL.  PREGNANCY, URINE     Status: Normal   Collection Time   04/17/12  7:50 AM      Component Value Range Comment   Preg Test, Ur NEGATIVE  NEGATIVE     US Abdomen Complete  04/17/2012  *RADIOLOGY REPORT*  Clinical Data:  Right upper quadrant abdominal pain with nausea and vomiting.  COMPLETE ABDOMINAL ULTRASOUND  Comparison:  Abdominal pelvic CT 11/13/2011.  Findings:  Gallbladder: Again demonstrated is a gallstone within the gallbladder neck, measuring approximately 1.4 cm in diameter.  This could not be dislodged.  However, there is no gallbladder wall thickening, pericholecystic fluid or sonographic Murphy's sign.  Common bile duct:   Normal in caliber without filling defects.  Liver:  Echogenicity is within normal limits.  No focal hepatic abnormalities are identified.  IVC:  Visualized portions appear unremarkable.  Pancreas:  Visualized portions appear unremarkable.  Spleen:  Visualized portions appear unremarkable.  Right Kidney:   The renal cortical thickness and echogenicity are preserved.  There is no hydronephrosis or focal abnormality. Renal length is 10.8 cm.  Left Kidney:   The renal cortical thickness and echogenicity are preserved.  There is no hydronephrosis or focal abnormality. Renal length  is 10.7 cm.  Abdominal aorta:  Visualized portions appear unremarkable.  IMPRESSION:  1.  Gallstone in the gallbladder neck.  No sonographic evidence of  cholecystitis or biliary dilatation. 2.  Otherwise unremarkable examination.  Original Report Authenticated By: Gerrianne Scale, M.D.       Assessment/Plan 1.  Low grade cholecystitis with choleithiasis:  Will admit patient to med-surg, make NPO for now, start IV abx, prn pain meds and prn zofran.  Plan for lap chole within the next 12-24 hours.  Discussed procedure with patient including RBA, including bleeding, infection, bowel injury and poss need for conversion to open procedure.  Patient agrees.  Dr. Derrell Lolling has seen patient and agrees with A and P.  Paige Monarrez 04/17/2012, 10:58 AM

## 2012-04-17 NOTE — Anesthesia Procedure Notes (Signed)
Procedure Name: Intubation Date/Time: 04/17/2012 1:33 PM Performed by: Arlice Colt B Pre-anesthesia Checklist: Patient identified, Emergency Drugs available, Suction available, Patient being monitored and Timeout performed Patient Re-evaluated:Patient Re-evaluated prior to inductionOxygen Delivery Method: Circle system utilized Preoxygenation: Pre-oxygenation with 100% oxygen Intubation Type: IV induction and Rapid sequence Laryngoscope Size: Mac and 4 Grade View: Grade I Tube type: Oral Tube size: 7.5 mm Number of attempts: 1 Airway Equipment and Method: Stylet Placement Confirmation: ETT inserted through vocal cords under direct vision,  positive ETCO2 and breath sounds checked- equal and bilateral Secured at: 21 cm Tube secured with: Tape Dental Injury: Teeth and Oropharynx as per pre-operative assessment

## 2012-04-17 NOTE — Anesthesia Preprocedure Evaluation (Addendum)
Anesthesia Evaluation  Patient identified by MRN, date of birth, ID band Patient awake    Reviewed: Allergy & Precautions, H&P , NPO status , Patient's Chart, lab work & pertinent test results  Airway Mallampati: I TM Distance: >3 FB Neck ROM: full    Dental  (+) Teeth Intact and Dental Advisory Given   Pulmonary  breath sounds clear to auscultation        Cardiovascular Exercise Tolerance: Good Rhythm:regular Rate:Normal     Neuro/Psych    GI/Hepatic   Endo/Other    Renal/GU      Musculoskeletal   Abdominal   Peds  Hematology   Anesthesia Other Findings   Reproductive/Obstetrics                          Anesthesia Physical Anesthesia Plan  ASA: III  Anesthesia Plan: General   Post-op Pain Management:    Induction: Intravenous  Airway Management Planned: Oral ETT  Additional Equipment:   Intra-op Plan:   Post-operative Plan: Extubation in OR  Informed Consent: I have reviewed the patients History and Physical, chart, labs and discussed the procedure including the risks, benefits and alternatives for the proposed anesthesia with the patient or authorized representative who has indicated his/her understanding and acceptance.   Dental advisory given  Plan Discussed with: CRNA, Anesthesiologist and Surgeon  Anesthesia Plan Comments: (Symptomatic cholelithiasis with persistent pain, N/V  Plan GA with RSI  Kipp Brood, MD)       Anesthesia Quick Evaluation

## 2012-04-18 ENCOUNTER — Telehealth (INDEPENDENT_AMBULATORY_CARE_PROVIDER_SITE_OTHER): Payer: Self-pay

## 2012-04-18 MED ORDER — HYDROCODONE-ACETAMINOPHEN 5-325 MG PO TABS: 1.0000 | ORAL_TABLET | ORAL | Status: AC | PRN

## 2012-04-18 NOTE — Progress Notes (Signed)
Discharge instructions/Med Rec Sheet reviewed w/ pt. Pt expressed understanding and copies given w/ prescriptions. Pt d/c'd in stable condition via w/c, accompanied by discharge volunteers 

## 2012-04-18 NOTE — Telephone Encounter (Signed)
Po appt made for 6-25. LMOM for pt to arrive at 1:15 and rtw date is 14 days after surgery. Unable to make appt before this date.

## 2012-04-18 NOTE — ED Provider Notes (Signed)
Medical screening examination/treatment/procedure(s) were performed by non-physician practitioner and as supervising physician I was immediately available for consultation/collaboration.   Laray Anger, DO 04/18/12 2204

## 2012-04-18 NOTE — Progress Notes (Signed)
1 Day Post-Op  Subjective: Resting quietly, no distress, some nausea this am following breakfast but no emisis. Otherwise feels good, anxious to go home.  Objective: Vital signs in last 24 hours: Temp:  [96.8 F (36 C)-98.3 F (36.8 C)] 98.2 F (36.8 C) (06/06 0555) Pulse Rate:  [52-85] 52  (06/06 0555) Resp:  [15-26] 18  (06/06 0555) BP: (93-113)/(52-64) 93/53 mmHg (06/06 0555) SpO2:  [93 %-100 %] 98 % (06/06 0555) Weight:  [140 lb (63.504 kg)] 140 lb (63.504 kg) (06/05 2300) Last BM Date: 04/17/12  Intake/Output from previous day: 06/05 0701 - 06/06 0700 In: 2782 [P.O.:240; I.V.:2542] Out: 1150 [Urine:1150] Intake/Output this shift:    General appearance: alert, cooperative, appears stated age and no distress Chest: BS CTA bilaterally Cardiac: S1-S2 no murmurs, rubs, or gallops heard. Abdomen: Soft, non tender, Hypoactive bowel sounds, no flatus or BM today. Some nausea reported after breakfast, no emisis. All surgical incisions appear well approximated, no erythema or drainage seen.  Lab Results:   [facility] 04/17/12 0745  WBC 13.5*  HGB 13.1  HCT 37.1  PLT 263   BMET  Basename 04/17/12 0745  NA 134*  K 3.7  CL 101  CO2 22  GLUCOSE 145*  BUN 10  CREATININE 0.71  CALCIUM 9.0   PT/INR No results found for this basename: LABPROT:2,INR:2 in the last 72 hours ABG No results found for this basename: PHART:2,PCO2:2,PO2:2,HCO3:2 in the last 72 hours  Studies/Results: Dg Cholangiogram Operative  04/17/2012  *RADIOLOGY REPORT*  Clinical Data:   Cholelithiasis.  INTRAOPERATIVE CHOLANGIOGRAM  Technique:  Cholangiographic images from the C-arm fluoroscopic device were submitted for interpretation post-operatively.  Please see the procedural report for the amount of contrast and the fluoroscopy time utilized.  Comparison:  Ultrasound 04/17/2012  Findings:  Single intraoperative image was obtained.  There is contrast within the common bile duct and central hepatic ducts.   No large filling defects or stones.  Contrast is present in the duodenum.  No significant biliary dilatation. Incomplete filling of the left intrahepatic bile ducts.  IMPRESSION: Patent biliary system without an obstructing lesion or stone.  Original Report Authenticated By: Richarda Overlie, M.D.   US Abdomen Complete  04/17/2012  *RADIOLOGY REPORT*  Clinical Data:  Right upper quadrant abdominal pain with nausea and vomiting.  COMPLETE ABDOMINAL ULTRASOUND  Comparison:  Abdominal pelvic CT 11/13/2011.  Findings:  Gallbladder: Again demonstrated is a gallstone within the gallbladder neck, measuring approximately 1.4 cm in diameter.  This could not be dislodged.  However, there is no gallbladder wall thickening, pericholecystic fluid or sonographic Murphy's sign.  Common bile duct:   Normal in caliber without filling defects.  Liver:  Echogenicity is within normal limits.  No focal hepatic abnormalities are identified.  IVC:  Visualized portions appear unremarkable.  Pancreas:  Visualized portions appear unremarkable.  Spleen:  Visualized portions appear unremarkable.  Right Kidney:   The renal cortical thickness and echogenicity are preserved.  There is no hydronephrosis or focal abnormality. Renal length is 10.8 cm.  Left Kidney:   The renal cortical thickness and echogenicity are preserved.  There is no hydronephrosis or focal abnormality. Renal length is 10.7 cm.  Abdominal aorta:  Visualized portions appear unremarkable.  IMPRESSION:  1.  Gallstone in the gallbladder neck.  No sonographic evidence of cholecystitis or biliary dilatation. 2.  Otherwise unremarkable examination.  Original Report Authenticated By: Gerrianne Scale, M.D.    Anti-infectives: Anti-infectives     Start     Dose/Rate  Route Frequency Ordered Stop   04/17/12 2000   piperacillin-tazobactam (ZOSYN) IVPB 3.375 g        3.375 g 100 mL/hr over 30 Minutes Intravenous 3 times per day 04/17/12 1613 04/17/12 2031   04/17/12 1100    Ampicillin-Sulbactam (UNASYN) 3 g in sodium chloride 0.9 % 100 mL IVPB        3 g 100 mL/hr over 60 Minutes Intravenous  Once 04/17/12 1052 04/17/12 1328          Assessment/Plan: s/p Procedure(s) (LRB): LAPAROSCOPIC CHOLECYSTECTOMY WITH INTRAOPERATIVE CHOLANGIOGRAM (N/A) Plan: 1. DC IV fluids as patient is tolerating po fluids well. Saline lock IV. 2. OOB, ambulate in hallways TID 3. Will re-eval for discharge later today after lunchtime, as BS are still hypoactive and no flatus/BM to date post-op.   LOS: 1 day    Huckleberry Martinson 04/18/2012

## 2012-04-18 NOTE — Discharge Summary (Signed)
Physician Discharge Summary  Patient ID: Phyllis Morales MRN: [id number] DOB/AGE: Aug 01, 1976 36 y.o.  Admit date: 04/17/2012 Discharge date: 04/18/2012  Admission Diagnoses:Acute Cholycysistitis with cholylithisis. Discharge Diagnoses: Status post cholysistectomy with cholyangiogram. Active Problems:  * No active hospital problems. *    Discharged Condition: stable  Hospital Course:[name] who presented with a several hour history of abdominal pain, epigastric, radiating to the back with associated nausea and vomiting. She presented to the emergency room where she was found to have epigastric abdominal tenderness, WBC 13,000, normal liver function tests, normal lipase, and an ultrasound showing gallstones. It was determined that this represented a low-grade acute cholecystitis. The patient wanted to come in the hospital and proceed with cholecystectomy. After discussion and evaluation by Dr. Derrell Lolling she was brought to the operating room for the surgical procedure. Pre and postoperativily she has received antibiotic coverage, she has remained hemodynamically stable, afebrile, and has progressed to a regular diet with minimal nausea easily controlled by zofran, and most likely related to anesthesia and post-operatve narcotic pain mangement versus an illeus.  Consults: None  Significant Diagnostic Studies: labs, choliangiogram.  Treatments: IV hydration, antibiotics, analgesia,respiratory therapy and surgery.  Discharge Exam: Blood pressure 104/56, pulse 57, temperature 98 F (36.7 C), temperature source Oral, resp. rate 19, height 5\' 5"  (1.651 m), weight 140 lb (63.504 kg), last menstrual period 03/29/2012, SpO2 99.00%.  General appearance: alert, cooperative, appears stated age and no distress Abdomen: All surgical incisions are C/D/I, minimal eccymosis at each site. No drainage, soft,non-tender,BS, hypoactive, no bloating. brief episode of nausea this morning  following breakfast, since resolved.  Disposition: 01-Home or Self Care   Medication List  As of 04/18/2012 12:37 PM   ASK your doctor about these medications         ibuprofen 200 MG tablet   Commonly known as: ADVIL,MOTRIN   Take 200-800 mg by mouth every 6 (six) hours as needed. Headache or pain      ORTHO TRI-CYCLEN (28) 0.18/0.215/0.25 MG-35 MCG tablet   Generic drug: Norgestimate-Ethinyl Estradiol Triphasic   Take 1 tablet by mouth daily.           Follow-up Information    Follow up with Ernestene Mention, MD in 2 weeks. (If symptoms worsen, call office.)    Contact information:   [facility] [address], [address] [place] [address] [phone]          Signed: Blenda Mounts 04/18/2012, 12:37 PM

## 2012-04-18 NOTE — Discharge Summary (Signed)
I have interviewed and examined this patient today. I agree with the discharge summary is dictated.  I will see her back in my office in 3-4 weeks for a postop check.   Phyllis Morales. Derrell Lolling, M.D., [facility] General and Minimally invasive Surgery Breast and Colorectal Surgery Office:   [phone] Pager:   [phone]

## 2012-04-18 NOTE — Discharge Instructions (Signed)
Gallstones Gallstones are a form of gallbladder disease. The gallbladder is a small organ that helps you digest food.  HOME CARE  Only take medicine as told by your doctor.   Eat a low-fat diet.   Follow up as told.  GET HELP RIGHT AWAY IF:   Your pain gets worse.   You develop yellow skin or eyes (jaundice).   The pain moves to another part of your belly (abdomen) or back.   You have a temperature by mouth above 102 F (38.9 C), not controlled by medicine.   You feel sick to your stomach (nauseous) and throw up (vomit).  MAKE SURE YOU:   Understand these instructions.   Will watch your condition.   Will get help right away if you are not doing well or get worse.  Document Released: 04/17/2008 Document Revised: 10/19/2011 Document Reviewed: 10/05/2009 Exit[employer] Patient Information 2012 [employer]. 

## 2012-04-18 NOTE — Progress Notes (Signed)
Patient interviewed and examined. Agree with evaluation and management plans by [name], NP.  Patient looks good and anticipate discharge later today.   Phyllis Morales. Derrell Lolling, M.D., [facility] General and Minimally invasive Surgery Breast and Colorectal Surgery Office:   [phone] Pager:   [phone]

## 2012-04-19 ENCOUNTER — Telehealth (INDEPENDENT_AMBULATORY_CARE_PROVIDER_SITE_OTHER): Payer: Self-pay | Admitting: General Surgery

## 2012-04-19 ENCOUNTER — Encounter (HOSPITAL_COMMUNITY): Payer: Self-pay | Admitting: General Surgery

## 2012-04-19 NOTE — Telephone Encounter (Signed)
Contacted the patient and advised her that I would call out an rx for Phenergan 25mg  #15 1q6h prn nausea, no refills. [facility] [address] [phone] Rewa Weissberg/ [facility]

## 2012-04-19 NOTE — Telephone Encounter (Signed)
Message copied by Latricia Heft on Fri Apr 19, 2012  1:36 PM ------      Message from: Ernestene Mention      Created: Fri Apr 19, 2012 12:29 PM       She should have been given a prescription for pain medicine at the time of discharge yesterday  . If she needs something for nausea you can call in Phenergan 25 mg either suppository or tablet. #15 tablets and tell her to take one every 6 hours when necessary nausea.            HMI

## 2012-04-19 NOTE — Telephone Encounter (Signed)
The patient contacted the office c/o constipation, expressed to her to increase her walking and water intake, use Milk of Magnesia or Mirlax. She also is experiencing some nausea and states she has not had a pain pill since last evening.Would like to have something called to her pharmacy for this.

## 2012-04-23 ENCOUNTER — Telehealth (INDEPENDENT_AMBULATORY_CARE_PROVIDER_SITE_OTHER): Payer: Self-pay

## 2012-04-23 NOTE — Telephone Encounter (Signed)
RTW note mailed to pt per her request.

## 2012-05-07 ENCOUNTER — Ambulatory Visit (INDEPENDENT_AMBULATORY_CARE_PROVIDER_SITE_OTHER): Payer: BC Managed Care – PPO | Admitting: General Surgery

## 2012-05-07 ENCOUNTER — Encounter (INDEPENDENT_AMBULATORY_CARE_PROVIDER_SITE_OTHER): Payer: Self-pay | Admitting: General Surgery

## 2012-05-07 VITALS — BP 118/68 | HR 68 | Temp 98.4°F | Resp 14 | Ht 60.0 in | Wt 137.5 lb

## 2012-05-07 DIAGNOSIS — K8020 Calculus of gallbladder without cholecystitis without obstruction: Secondary | ICD-10-CM

## 2012-05-07 NOTE — Progress Notes (Signed)
Subjective:     Patient ID: Phyllis Morales, female   DOB: [birth year], 36 y.o.   MRN: [id number]  HPI Urgent laparoscopic cholecystectomy with IOC performed 04/17/2012.  Gallbladder was acutely inflamed  And edematous. Pathology showed chronic cholecystitis with cholelithiasis. IOC normal. She is doing well and has no complaints.  Review of Systems     Objective:   Physical Exam Patient in no distress, good spirits.  Abdomen soft, nontender, wounds have healed well.    Assessment:     Chronic cholecystitis with cholelithiasis, uneventful recovery following laparoscopic cholecystectomy with cholangiogram    Plan:     Low fat diet. Resume normal activities. return prn.   Angelia Mould. Derrell Lolling, M.D., [facility] General and Minimally invasive Surgery Breast and Colorectal Surgery Office:   [phone] Pager:   [phone]

## 2012-05-07 NOTE — Patient Instructions (Signed)
You have recovered from your  gallbladder operation without any obvious complications  A low fat diet is recommended  He may resume normal physical activities without  Return to see Dr. Derrell Lolling if any further problems arise.

## 2012-07-04 IMAGING — CT CT ABDOMEN W/ CM
2 of 4 series · 17 of 46 positions shown, 19 images · IV contrast (omnipaque)
Comparison: Abdominal ultrasound at [HOSPITAL] [HOSPITAL]

CLINICAL DATA: Abdominal pain.  Cholelithiasis on prior
ultrasound.

CT ABDOMEN WITH CONTRAST
TECHNIQUE: Multidetector CT imaging of the abdomen was performed
using the standard protocol following bolus administration of
intravenous contrast.
Contrast: 100mL OMNIPAQUE IOHEXOL 300 MG/ML IV SOLN

[Series 2: abdomen w/ · axial · 0.70mm/px · z∈[-216,+34]mm · 14 of 56 slices shown, 16 images]
[im 3/56  soft-tissue]
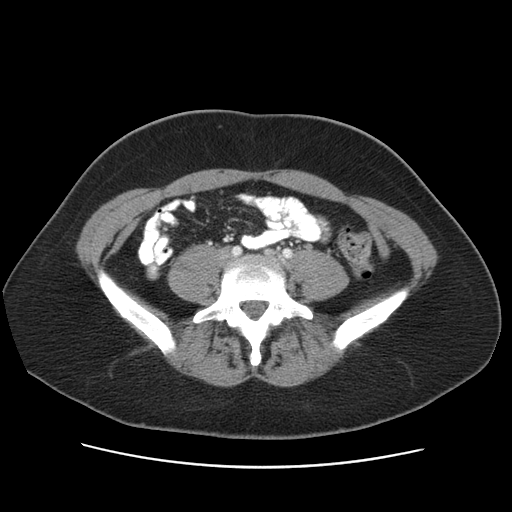
[im 3/56  bone]
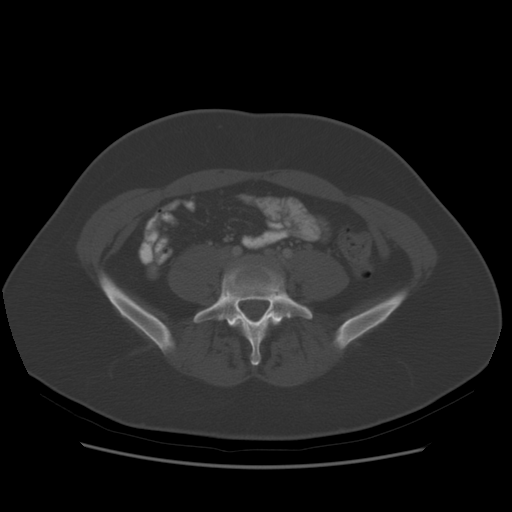
[im 8/56  soft-tissue]
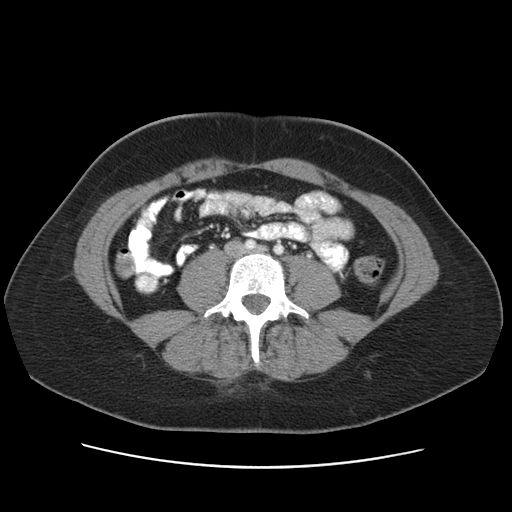
[im 11/56  soft-tissue]
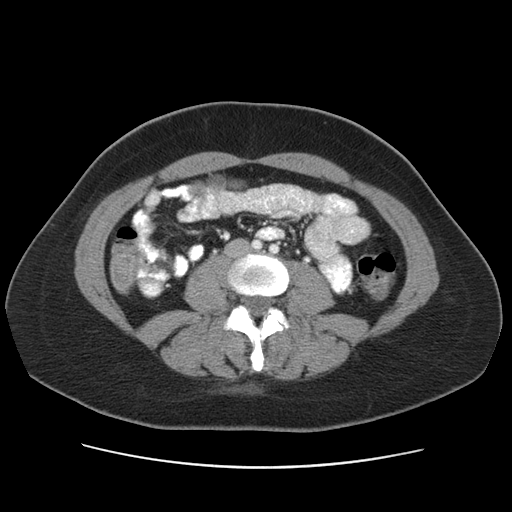
[im 16/56  soft-tissue]
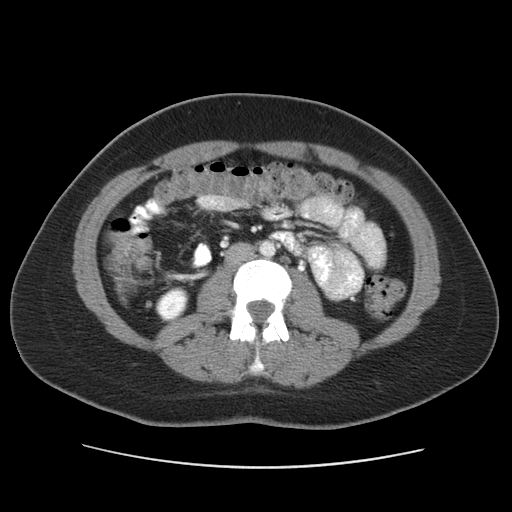
[im 18/56  soft-tissue]
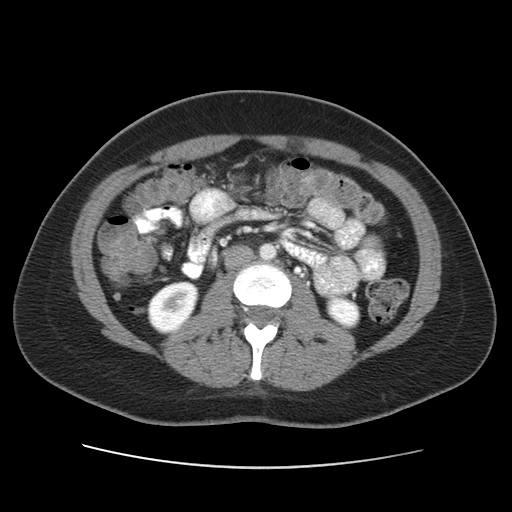
[im 23/56  soft-tissue]
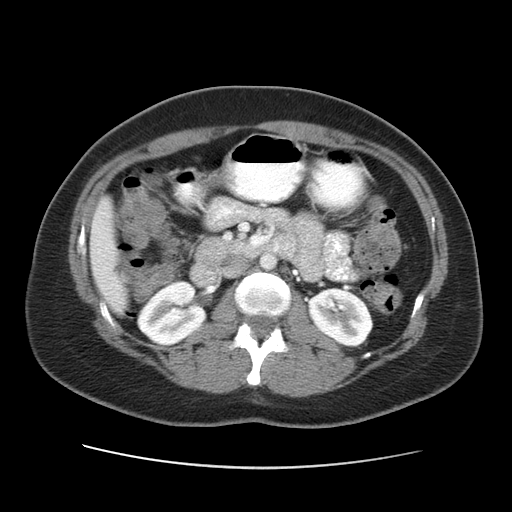
[im 26/56  soft-tissue]
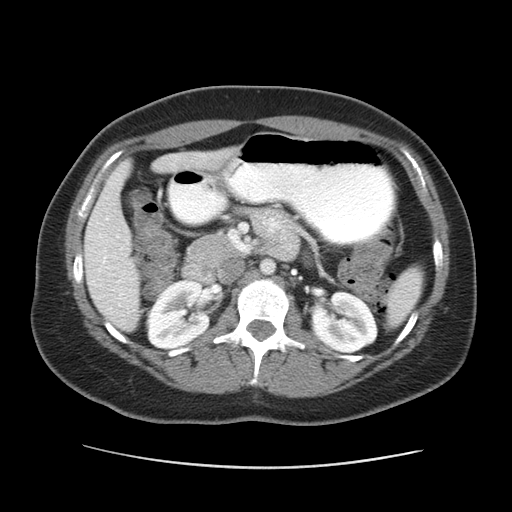
[im 31/56  soft-tissue]
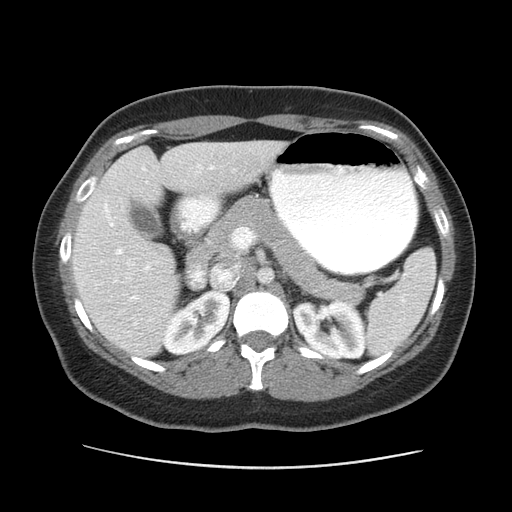
[im 33/56  soft-tissue]
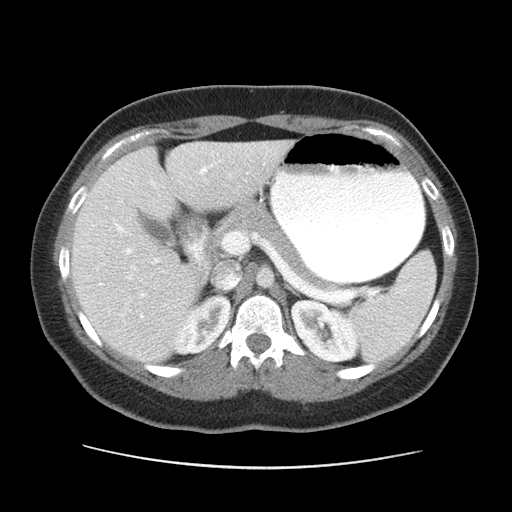
[im 33/56  bone]
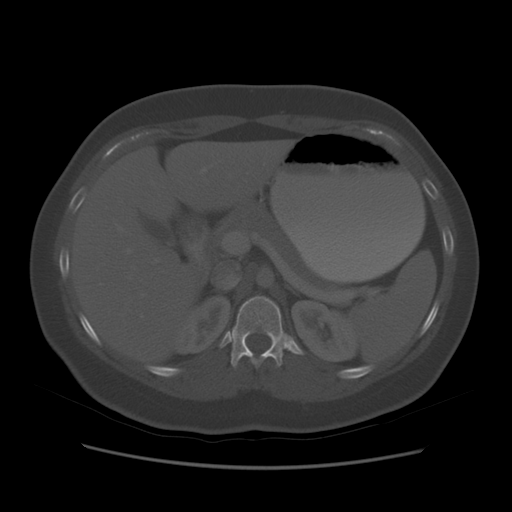
[im 38/56  soft-tissue]
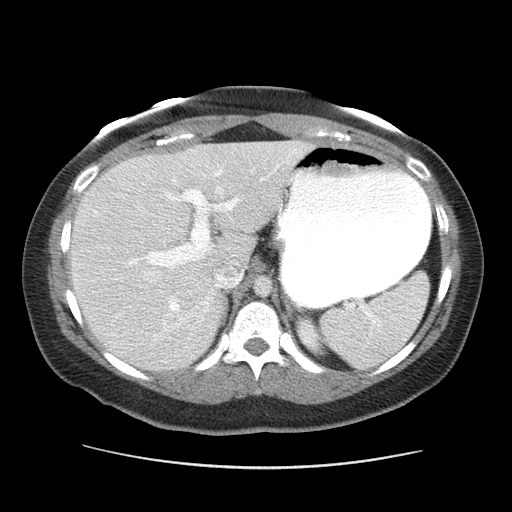
[im 41/56  soft-tissue]
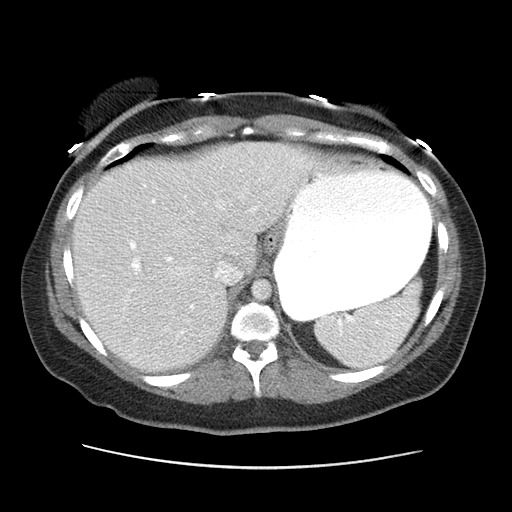
[im 46/56  soft-tissue]
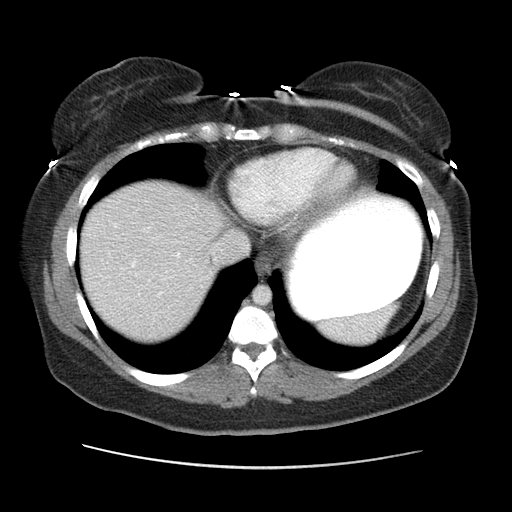
[im 48/56  soft-tissue]
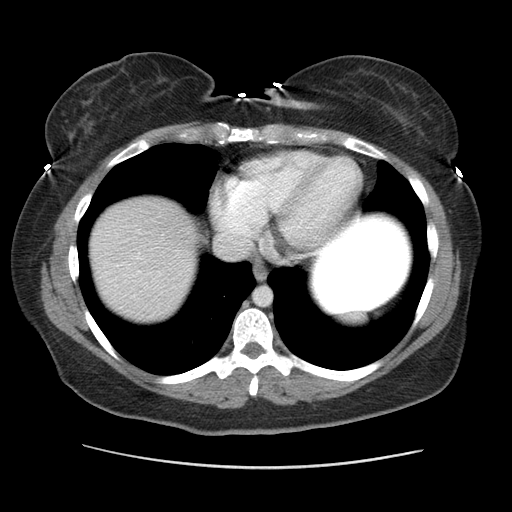
[im 53/56  soft-tissue]
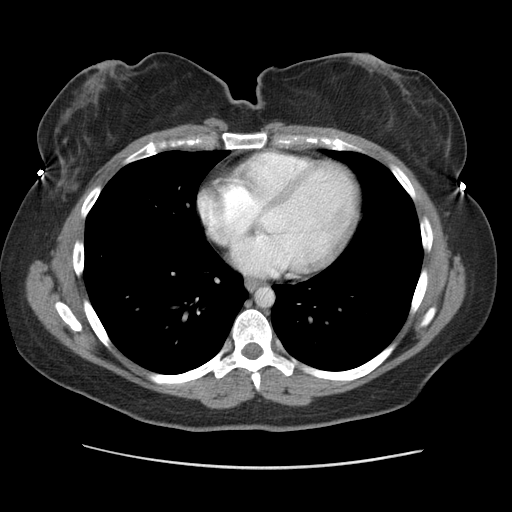

[Series 400: cor · coronal · 0.70mm/px · 3 of 110 slices shown]
[im 37/110  soft-tissue]
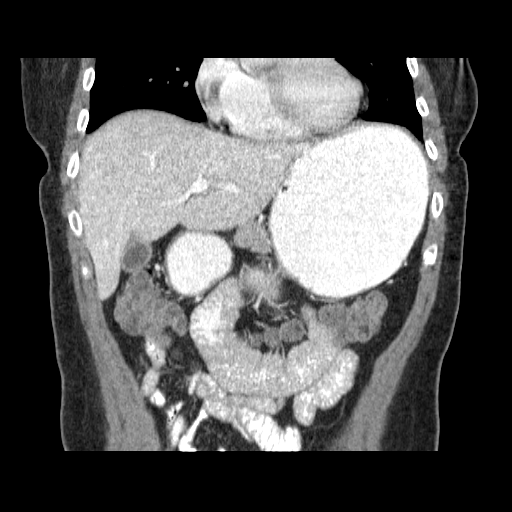
[im 49/110  soft-tissue]
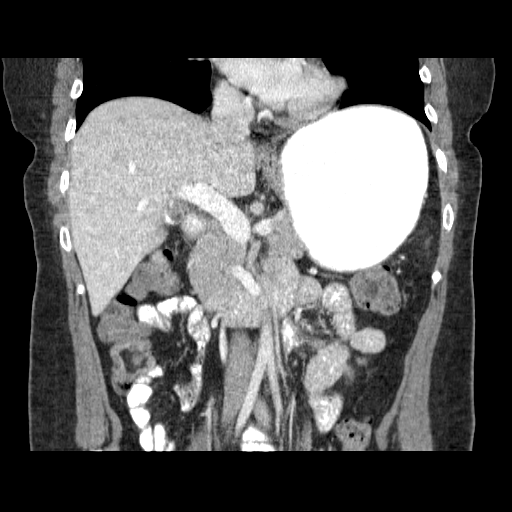
[im 61/110  soft-tissue]
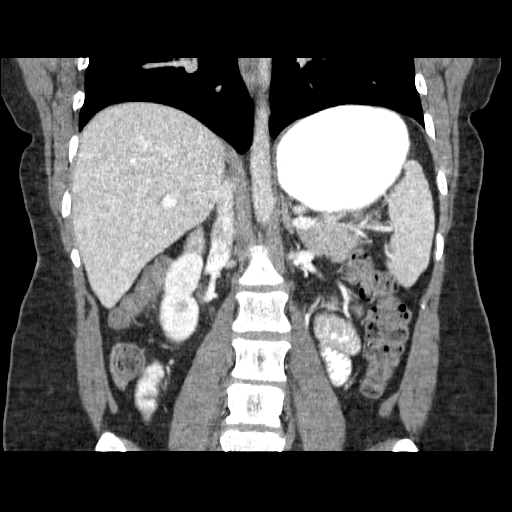

[17 of 46 positions shown; findings below may reference images not displayed]

FINDINGS: Lung bases are clear.  Gallstones are again noted within
the otherwise normal appearing gallbladder allowing for lack of
distention.  Liver, spleen, pancreas, adrenal glands, and kidneys
are normal.  The appendix is normal.  Visualized bowel is
unremarkable.  The stomach is mildly distended by ingested oral
contrast material without evidence for obstruction to distal
passage of contrast.  No ascites or lymphadenopathy.  No free air.
No visualized osseous abnormality.
IMPRESSION: Gallstones again noted, otherwise normal exam.

## 2013-03-31 ENCOUNTER — Telehealth: Payer: Self-pay | Admitting: Gynecology

## 2013-03-31 NOTE — Telephone Encounter (Signed)
Patient states she changed from OCP to Nuvaring to decrease menses and it is not working.  Cycles at still 7 days and Nuvaring is $30/month and not helping.  Request to change to lower cost option and one to help decrease her cycle.  Advised to check preferred med list with ins company so we will know what cost effective options are.  Patient to e-mail this info to me.

## 2013-03-31 NOTE — Telephone Encounter (Signed)
Patient has some questions for nurse about her medication. Patient did not give name of rx.

## 2013-04-02 NOTE — Telephone Encounter (Signed)
Call to patient to follow up on what meds on preferred plan. LMTCB.

## 2013-04-03 NOTE — Telephone Encounter (Signed)
Patient states she has not been able to look up preferred med list or get thru on telephone to ins company. Requests we just call in something and she will call back if too expensive.  Patient asking if we can call this in today so she can start it since she is now late restarting Nuvaring.  Took Nuvaring out on 03-24-13  And menses lasted a full 7 days.  Advised patient she should wait till next cycle to start new RX.  Patient asking if anything we can do to help ease her menses. Please advise what to cahne to and when to start? Also what to do to help with menses?

## 2013-04-04 ENCOUNTER — Other Ambulatory Visit: Payer: Self-pay | Admitting: Gynecology

## 2013-04-04 MED ORDER — LEVONORGEST-ETH ESTRAD 91-DAY 0.15-0.03 &0.01 MG PO TABS: 1.0000 | ORAL_TABLET | Freq: Every day | ORAL | Status: DC

## 2013-04-04 NOTE — Telephone Encounter (Signed)
LM on pts VM that new RX called.  Should she wait till next menses to start? Ring out since 03-24-13.  patietn asking if we can do anything to help cycle?

## 2013-04-04 NOTE — Telephone Encounter (Signed)
i sent in seasonique, usually if they are high tier it flashes red, this didn't

## 2013-04-04 NOTE — Telephone Encounter (Signed)
Dr. Farrel Gobble this patein saw you last and not sure what you want to change her to.  Pt. Of DL and you.  Please review.

## 2013-06-10 ENCOUNTER — Telehealth: Payer: Self-pay | Admitting: Gynecology

## 2013-06-10 NOTE — Telephone Encounter (Signed)
Patient has a question about birth control for the nurse.

## 2013-06-10 NOTE — Telephone Encounter (Signed)
Patient concern of birth control pills she is currently on she has not had cycle. Is now on her 2 nd pack. Patient last AEX 07/01/2012 paper chart with D. Leonard,CNM. Patient put on Seasonique birth control pills 04/04/2013. Patient had nuvaring removed. Started 1st pack in June, states had no cycle Now with 2nd pack and concern if she is taking them right, continuously. Explained to patient Solmon Ice is an extended cycle pill. She is taking correctly, continuously of 11 weeks active pills and the 12th week of inactive pills and should have cycle during the 12th week. Suggested she may do a home HCG test for peace of mind. Patient understands this now . Will call if any further problems.

## 2013-07-04 ENCOUNTER — Encounter: Payer: Self-pay | Admitting: Gynecology

## 2013-07-04 ENCOUNTER — Ambulatory Visit (INDEPENDENT_AMBULATORY_CARE_PROVIDER_SITE_OTHER): Payer: BC Managed Care – PPO | Admitting: Gynecology

## 2013-07-04 VITALS — BP 120/68 | HR 66 | Resp 12 | Ht 59.0 in | Wt 148.0 lb

## 2013-07-04 DIAGNOSIS — Z9889 Other specified postprocedural states: Secondary | ICD-10-CM

## 2013-07-04 DIAGNOSIS — Z0000 Encounter for general adult medical examination without abnormal findings: Secondary | ICD-10-CM

## 2013-07-04 DIAGNOSIS — Z309 Encounter for contraceptive management, unspecified: Secondary | ICD-10-CM

## 2013-07-04 DIAGNOSIS — Z01419 Encounter for gynecological examination (general) (routine) without abnormal findings: Secondary | ICD-10-CM

## 2013-07-04 LAB — POCT URINALYSIS DIPSTICK: Urobilinogen, UA: NEGATIVE

## 2013-07-04 MED ORDER — LEVONORGEST-ETH ESTRAD 91-DAY 0.15-0.03 &0.01 MG PO TABS: 1.0000 | ORAL_TABLET | Freq: Every day | ORAL | Status: DC

## 2013-07-04 NOTE — Progress Notes (Signed)
37 y.o. Married Caucasian female   G1P1001 here for annual exam. Pt is currently sexually active.  Recently started on seasonique, very happy with it.  Pt quit job today's last day.  Patient's last menstrual period was 05/01/2013.          Sexually active: yes  The current method of family planning is OCP (estrogen/progesterone).    Exercising: no  The patient does not participate in regular exercise at present. Last pap: 07/01/12 NEG HR HPV (per your note repap 3 years) Alcohol: no Tobacco: no BSE: sometimes  Hgb: PCP ; Urine:  Blood 2    Health Maintenance  Topic Date Due  . Influenza Vaccine  07/14/2013  . Tetanus/tdap  11/13/2013  . Pap Smear  07/02/2015    No family history on file.  Patient Active Problem List   Diagnosis Date Noted  . Gallstones 05/07/2012    Past Medical History  Diagnosis Date  . Sleep disturbance, unspecified     "just not sleeping well sometimes"  . Headache(784.0) 04/17/12    "maybe once/wk"  . Abnormal Pap smear 2004    Hx of abnormal pap smear     Past Surgical History  Procedure Laterality Date  . Dilation and curettage of uterus  ~ 2002  . Cholecystectomy  04/17/2012    Procedure: LAPAROSCOPIC CHOLECYSTECTOMY WITH INTRAOPERATIVE CHOLANGIOGRAM;  Surgeon: Ernestene Mention, MD;  Location: [facility] OR;  Service: General;  Laterality: N/A;  . 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  Current Outpatient Prescriptions  Medication Sig Dispense Refill  . ALPRAZolam (XANAX) 0.25 MG tablet Take 0.25 mg by mouth as needed for sleep.      Marland Kitchen ibuprofen (ADVIL,MOTRIN) 200 MG tablet Take 200-800 mg by mouth every 6 (six) hours as needed. Headache or pain      . Levonorgestrel-Ethinyl Estradiol (SEASONIQUE) 0.15-0.03 &0.01 MG tablet Take 1 tablet by mouth daily.  1 Package  4   No current facility-administered medications for this visit.     ROS: Pertinent items are noted in HPI.  Exam:    BP 120/68  Pulse 66  Resp 12  Ht 4\' 11"  (1.499 m)  Wt 148 lb (67.132 kg)  BMI 29.88 kg/m2  LMP 05/01/2013 Weight change: @WEIGHTCHANGE @ Last 3 height recordings:  Ht Readings from Last 3 Encounters:  07/04/13 4\' 11"  (1.499 m)  05/07/12 5' (1.524 m)  04/18/12 5\' 5"  (1.651 m)   General appearance: alert, cooperative and appears stated age Head: Normocephalic, without obvious abnormality, atraumatic Neck: no adenopathy, no carotid bruit, no JVD, supple, symmetrical, trachea midline and thyroid not enlarged, symmetric, no tenderness/mass/nodules Lungs: clear to auscultation bilaterally Breasts: normal appearance, no masses or tenderness Heart: regular rate and rhythm, S1, S2 normal, no murmur, click, rub or gallop Abdomen: soft, non-tender; bowel sounds normal; no masses,  no organomegaly Extremities: extremities normal, atraumatic, no cyanosis or edema Skin: Skin color, texture, turgor normal. No rashes or lesions Lymph nodes: Cervical, supraclavicular, and axillary nodes normal. no inguinal nodes palpated Neurologic: Grossly normal   Pelvic: External genitalia:  no lesions              Urethra: normal appearing urethra with no masses, tenderness or lesions              Bartholins and Skenes: normal  Vagina: normal appearing vagina with normal color and discharge, no lesions              Cervix: normal appearance              Pap taken: yes        Bimanual Exam:  Uterus:  uterus is normal size, shape, consistency and nontender                                      Adnexa:    normal adnexa in size, nontender and no masses                                      Rectovaginal: Confirms                                      Anus:  normal sphincter tone, no lesions  A: well woman no contraindication to continue use of oral contraceptives Contraceptive management History of LEEP  CIN II     P: pap smear for CIN II  follow up counseled on breast self exam, use and side effects of OCP's, adequate intake of calcium and vitamin D, diet and exercise return annually or prn   An After Visit Summary was printed and given to the patient.

## 2013-07-08 LAB — IPS PAP SMEAR ONLY

## 2013-10-16 ENCOUNTER — Telehealth: Payer: Self-pay | Admitting: Gynecology

## 2013-10-16 NOTE — Telephone Encounter (Signed)
Patient needs refill of  Levonorgestrel-Ethinyl Estradiol (SEASONIQUE) 0.15-0.03 &0.01 MG tablet  Take 1 tablet by mouth daily., Starting 07/04/2013, Until Discontinued, Normal, Last Dose: Not Recorded  Refills: 4 ordered [id number] [place], [place] - [address] AT [address] [facility] [address]

## 2013-10-16 NOTE — Telephone Encounter (Signed)
Spoke with pharmacy patient has 4 refills left. Left detailed message for patient that she has refills and to call back prn.

## 2013-10-21 ENCOUNTER — Telehealth: Payer: Self-pay | Admitting: Gynecology

## 2013-10-21 NOTE — Telephone Encounter (Signed)
Spoke with patient. She does not currently have insurance coverage [facility] and the $240 cost is for generic. Asked patient if she desires to change or search for more cost effective options. Patient is going to call [facility] and price compare and call back.

## 2013-10-21 NOTE — Telephone Encounter (Signed)
Medication question about a lower-cost birth control. Patient is without insurance at this time and her current RX is $240.00 per month.  [facility] [address]

## 2014-02-03 ENCOUNTER — Telehealth: Payer: Self-pay | Admitting: Gynecology

## 2014-02-03 MED ORDER — LEVONORGESTREL-ETHINYL ESTRAD 0.1-20 MG-MCG PO TABS: 1.0000 | ORAL_TABLET | Freq: Every day | ORAL | Status: DC

## 2014-02-03 NOTE — Telephone Encounter (Signed)
Patient called back and states any Rx would be cheaper for her at this moment. She doesn't have any insurance. - Patient needs Rx today bc she ran out yesterday.

## 2014-02-03 NOTE — Telephone Encounter (Signed)
-  Last AEX and refill 07/04/2013 #28/4 refills.    "Karen Chafe, RN at 10/21/2013 11:08 AM     Status: Signed        Spoke with patient. She does not currently have insurance coverage [facility] and the $240 cost is for generic. Asked patient if she desires to change or search for more cost effective options. Patient is going to call [facility] and price compare and call back. "    - Patient didn't call back about changing to another cost effective option. -Please advise.

## 2014-02-03 NOTE — Telephone Encounter (Signed)
See if pt can get something at health dept otherwise i will send one pack in for her

## 2014-02-03 NOTE — Telephone Encounter (Signed)
Spoke with patient. She does not have insurance. Requests birth control to [facility]. Spoke with Dr. Charlies Constable. Okay to order [place]. One month with five refills sent to pharmacy of choice. Patient aware.  Routing to provider for final review. Patient agreeable to disposition. Will close encounter

## 2014-02-03 NOTE — Telephone Encounter (Signed)
Pt does not have health insurance and would like to switch to a generic birth control pt states she has to start this medication today so can someone please call her asap with a new medication

## 2014-06-22 ENCOUNTER — Other Ambulatory Visit: Payer: Self-pay

## 2014-06-22 NOTE — Telephone Encounter (Signed)
Fax from [facility], [address] Last AEX: 07/04/13 Last refill:02/03/14 #1 X 5 Current AEX:not scheduled  Pt needs an appt

## 2014-07-13 ENCOUNTER — Encounter: Payer: Self-pay | Admitting: Gynecology

## 2014-07-13 ENCOUNTER — Ambulatory Visit (INDEPENDENT_AMBULATORY_CARE_PROVIDER_SITE_OTHER): Payer: Managed Care, Other (non HMO) | Admitting: Gynecology

## 2014-07-13 VITALS — BP 110/62 | HR 66 | Resp 18 | Ht 60.0 in | Wt 158.0 lb

## 2014-07-13 DIAGNOSIS — Z0000 Encounter for general adult medical examination without abnormal findings: Secondary | ICD-10-CM

## 2014-07-13 DIAGNOSIS — R635 Abnormal weight gain: Secondary | ICD-10-CM

## 2014-07-13 DIAGNOSIS — Z3041 Encounter for surveillance of contraceptive pills: Secondary | ICD-10-CM

## 2014-07-13 DIAGNOSIS — Z01419 Encounter for gynecological examination (general) (routine) without abnormal findings: Secondary | ICD-10-CM

## 2014-07-13 DIAGNOSIS — Z124 Encounter for screening for malignant neoplasm of cervix: Secondary | ICD-10-CM

## 2014-07-13 DIAGNOSIS — Z9889 Other specified postprocedural states: Secondary | ICD-10-CM

## 2014-07-13 LAB — POCT URINALYSIS DIPSTICK
Blood, UA: 1
Leukocytes, UA: NEGATIVE
Urobilinogen, UA: NEGATIVE
pH, UA: 5

## 2014-07-13 LAB — HEMOGLOBIN, FINGERSTICK: Hemoglobin, fingerstick: 12.9 g/dL (ref 12.0–16.0)

## 2014-07-13 MED ORDER — LEVONORGESTREL-ETHINYL ESTRAD 0.1-20 MG-MCG PO TABS: 1.0000 | ORAL_TABLET | Freq: Every day | ORAL | Status: DC

## 2014-07-13 NOTE — Progress Notes (Signed)
38 y.o. Married Caucasian female   G1P1001 here for annual exam. Pt is currently sexually active.  Pt reports weight gain.  Pt states that her cycle is regular on aviane, but prefers to not have a cycle, was on camerisse in past.    Patient's last menstrual period was 06/13/2014.          Sexually active: Yes.    The current method of family planning is OCP (estrogen/progesterone).    Exercising: No.  The patient does not participate in regular exercise at present. Last pap: 07/04/13 NEG Alcohol: no Tobacco: no BSE: no  Hgb: 12.9 ; Urine: Blood 1   Health Maintenance  Topic Date Due  . Tetanus/tdap  11/13/2013  . Influenza Vaccine  06/13/2014  . Pap Smear  07/04/2016    No family history on file.  Patient Active Problem List   Diagnosis Date Noted  . Gallstones 05/07/2012    Past Medical History  Diagnosis Date  . Sleep disturbance, unspecified     "just not sleeping well sometimes"  . Headache(784.0) 04/17/12    "maybe once/wk"  . Abnormal Pap smear 2004    Hx of abnormal pap smear     Past Surgical History  Procedure Laterality Date  . Dilation and curettage of uterus  ~ 2002  . Cholecystectomy  04/17/2012    Procedure: LAPAROSCOPIC CHOLECYSTECTOMY WITH INTRAOPERATIVE CHOLANGIOGRAM;  Surgeon: Adin Hector, MD;  Location: [facility];  Service: General;  Laterality: N/A;  . 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  Current Outpatient Prescriptions  Medication Sig Dispense Refill  . ALPRAZolam (XANAX) 0.25 MG tablet Take 0.25 mg by mouth as needed for sleep.      Marland Kitchen ibuprofen (ADVIL,MOTRIN) 200 MG tablet Take 200-800 mg by mouth every 6 (six) hours as needed. Headache or pain      . levonorgestrel-ethinyl estradiol (AVIANE,ALESSE,LESSINA) 0.1-20 MG-MCG tablet Take 1 tablet by mouth daily.  1 Package  5   No current facility-administered medications  for this visit.    ROS: Pertinent items are noted in HPI.  Exam:    BP 110/62  Pulse 66  Resp 18  Ht 5' (1.524 m)  Wt 158 lb (71.668 kg)  BMI 30.86 kg/m2  LMP 06/13/2014 Weight change: @WEIGHTCHANGE @ Last 3 height recordings:  Ht Readings from Last 3 Encounters:  07/13/14 5' (1.524 m)  07/04/13 4\' 11"  (1.499 m)  05/07/12 5' (1.524 m)   General appearance: alert, cooperative and appears stated age Head: Normocephalic, without obvious abnormality, atraumatic Neck: no adenopathy, no carotid bruit, no JVD, supple, symmetrical, trachea midline and thyroid not enlarged, symmetric, no tenderness/mass/nodules Lungs: clear to auscultation bilaterally Breasts: normal appearance, no masses or tenderness Heart: regular rate and rhythm, S1, S2 normal, no murmur, click, rub or gallop Abdomen: soft, non-tender; bowel sounds normal; no masses,  no organomegaly Extremities: extremities normal, atraumatic, no cyanosis or edema Skin: Skin color, texture, turgor normal. No rashes or lesions Lymph nodes: Cervical, supraclavicular, and axillary nodes normal. no inguinal nodes palpated Neurologic: Grossly normal   Pelvic: External genitalia:  no lesions              Urethra: normal appearing urethra with no masses, tenderness or lesions              Bartholins and Skenes: Bartholin's, Urethra, Skene's normal  Vagina: normal appearing vagina with normal color and discharge, no lesions              Cervix: normal appearance              Pap taken: Yes.          Bimanual Exam:  Uterus:  uterus is normal size, shape, consistency and nontender                                      Adnexa:    normal adnexa in size, nontender and no masses                                      Rectovaginal: Confirms                                      Anus:  normal sphincter tone, no lesions   1. Routine gynecological examination counseled on breast self exam, mammography screening, adequate intake of  calcium and vitamin D, diet and exercise return annually or prn    2. Laboratory examination ordered as part of a routine general medical examination  - POCT Urinalysis Dipstick - Hemoglobin, fingerstick  3. Screening for cervical cancer H/o LEEP 2004 for CIN II, pap with HPV in 2013, negative - PAP Smear Only (IPS)  4. Weight gain reviewed weight available through epic, pt consistently gained 10# every 1y, approximately 1#/m or 100cal/d.  Discussed importance of diet, pt eats poptart for breakfast and often no lunch, stressed importance of protein, better food choices.  Stressed that she make better food choices, increase exercise. Pt had lost weight with medications-reviewed weight gain after stopping is common due to no dietary/actvity modifications needed  5. Encounter for surveillance of contraceptive pills Pt prefers extended cycle, instructed how to do with currrent ocp - levonorgestrel-ethinyl estradiol (AVIANE,ALESSE,LESSINA) 0.1-20 MG-MCG tablet; Take 1 tablet by mouth daily. No placebo  Dispense: 4 Package; Refill: 3  An After Visit Summary was printed and given to the patient.

## 2014-07-15 LAB — IPS PAP SMEAR ONLY

## 2014-08-05 ENCOUNTER — Encounter: Payer: Self-pay | Admitting: Gynecology

## 2014-09-14 ENCOUNTER — Encounter: Payer: Self-pay | Admitting: Gynecology

## 2014-10-14 ENCOUNTER — Telehealth: Payer: Self-pay

## 2014-10-14 NOTE — Telephone Encounter (Signed)
lmtcb to reschedule AEX with Dr. Lathrop 

## 2014-12-01 ENCOUNTER — Telehealth: Payer: Self-pay | Admitting: Obstetrics and Gynecology

## 2014-12-01 NOTE — Telephone Encounter (Signed)
Spoke with patient. Patient states that she is currently taking Avaine for OCP. Takes continuously with no placebo pills. "I usually do not have a cycle for 3-4 months. I have been spotting now for 3 and a half weeks." States she has been taking her birth control at the same time daily and has not missed any pills. "I am concerned." Advised will need to be seen with provider in office for evaluation. Patient is agreeable. Appointment scheduled for 1/21 at 11:15am with Regina Eck CNM. Agreeable to date and time.   Routing to provider for final review. Patient agreeable to disposition. Will close encounter

## 2014-12-01 NOTE — Telephone Encounter (Signed)
Patient calling stating she has been "having a menstrual cycle for the last three and a half weeks."

## 2014-12-03 ENCOUNTER — Ambulatory Visit (INDEPENDENT_AMBULATORY_CARE_PROVIDER_SITE_OTHER): Payer: 59 | Admitting: Certified Nurse Midwife

## 2014-12-03 ENCOUNTER — Encounter: Payer: Self-pay | Admitting: Certified Nurse Midwife

## 2014-12-03 VITALS — BP 98/64 | HR 68 | Resp 12 | Ht 60.0 in | Wt 155.2 lb

## 2014-12-03 DIAGNOSIS — N926 Irregular menstruation, unspecified: Secondary | ICD-10-CM

## 2014-12-03 LAB — POCT URINE PREGNANCY: Preg Test, Ur: NEGATIVE

## 2014-12-03 LAB — HEMOGLOBIN, FINGERSTICK: Hemoglobin, fingerstick: 13.9 g/dL (ref 12.0–16.0)

## 2014-12-03 NOTE — Patient Instructions (Signed)
Dysfunctional Uterine Bleeding Normally, menstrual periods begin between ages 11 to 17 in young women. A normal menstrual cycle/period may begin every 23 days up to 35 days and lasts from 1 to 7 days. Around 12 to 14 days before your menstrual period starts, ovulation (ovary produces an egg) occurs. When counting the time between menstrual periods, count from the first day of bleeding of the previous period to the first day of bleeding of the next period. Dysfunctional (abnormal) uterine bleeding is bleeding that is different from a normal menstrual period. Your periods may come earlier or later than usual. They may be lighter, have blood clots or be heavier. You may have bleeding between periods, or you may skip one period or more. You may have bleeding after sexual intercourse, bleeding after menopause, or no menstrual period. CAUSES   Pregnancy (normal, miscarriage, tubal).  IUDs (intrauterine device, birth control).  Birth control pills.  Hormone treatment.  Menopause.  Infection of the cervix.  Blood clotting problems.  Infection of the inside lining of the uterus.  Endometriosis, inside lining of the uterus growing in the pelvis and other female organs.  Adhesions (scar tissue) inside the uterus.  Obesity or severe weight loss.  Uterine polyps inside the uterus.  Cancer of the vagina, cervix, or uterus.  Ovarian cysts or polycystic ovary syndrome.  Medical problems (diabetes, thyroid disease).  Uterine fibroids (noncancerous tumor).  Problems with your female hormones.  Endometrial hyperplasia, very thick lining and enlarged cells inside of the uterus.  Medicines that interfere with ovulation.  Radiation to the pelvis or abdomen.  Chemotherapy. DIAGNOSIS   Your doctor will discuss the history of your menstrual periods, medicines you are taking, changes in your weight, stress in your life, and any medical problems you may have.  Your doctor will do a physical  and pelvic examination.  Your doctor may want to perform certain tests to make a diagnosis, such as:  Pap test.  Blood tests.  Cultures for infection.  CT scan.  Ultrasound.  Hysteroscopy.  Laparoscopy.  MRI.  Hysterosalpingography.  D and C.  Endometrial biopsy. TREATMENT  Treatment will depend on the cause of the dysfunctional uterine bleeding (DUB). Treatment may include:  Observing your menstrual periods for a couple of months.  Prescribing medicines for medical problems, including:  Antibiotics.  Hormones.  Birth control pills.  Removing an IUD (intrauterine device, birth control).  Surgery:  D and C (scrape and remove tissue from inside the uterus).  Laparoscopy (examine inside the abdomen with a lighted tube).  Uterine ablation (destroy lining of the uterus with electrical current, laser, heat, or freezing).  Hysteroscopy (examine cervix and uterus with a lighted tube).  Hysterectomy (remove the uterus). HOME CARE INSTRUCTIONS   If medicines were prescribed, take exactly as directed. Do not change or switch medicines without consulting your caregiver.  Long term heavy bleeding may result in iron deficiency. Your caregiver may have prescribed iron pills. They help replace the iron that your body lost from heavy bleeding. Take exactly as directed.  Do not take aspirin or medicines that contain aspirin one week before or during your menstrual period. Aspirin may make the bleeding worse.  If you need to change your sanitary pad or tampon more than once every 2 hours, stay in bed with your feet elevated and a cold pack on your lower abdomen. Rest as much as possible, until the bleeding stops or slows down.  Eat well-balanced meals. Eat foods high in iron. Examples   are:  Leafy green vegetables.  Whole-grain breads and cereals.  Eggs.  Meat.  Liver.  Do not try to lose weight until the abnormal bleeding has stopped and your blood iron level is  back to normal. Do not lift more than ten pounds or do strenuous activities when you are bleeding.  For a couple of months, make note on your calendar, marking the start and ending of your period, and the type of bleeding (light, medium, heavy, spotting, clots or missed periods). This is for your caregiver to better evaluate your problem. SEEK MEDICAL CARE IF:   You develop nausea (feeling sick to your stomach) and vomiting, dizziness, or diarrhea while you are taking your medicine.  You are getting lightheaded or weak.  You have any problems that may be related to the medicine you are taking.  You develop pain with your DUB.  You want to remove your IUD.  You want to stop or change your birth control pills or hormones.  You have any type of abnormal bleeding mentioned above.  You are over 16 years old and have not had a menstrual period yet.  You are 39 years old and you are still having menstrual periods.  You have any of the symptoms mentioned above.  You develop a rash. SEEK IMMEDIATE MEDICAL CARE IF:   An oral temperature above 102 F (38.9 C) develops.  You develop chills.  You are changing your sanitary pad or tampon more than once an hour.  You develop abdominal pain.  You pass out or faint. Document Released: 10/27/2000 Document Revised: 01/22/2012 Document Reviewed: 09/28/2009 ExitCare Patient Information 2015 [employer]. This information is not intended to replace advice given to you by your health care provider. Make sure you discuss any questions you have with your health care provider.  

## 2014-12-03 NOTE — Progress Notes (Signed)
39 y.o. Married Caucasian female G1P1001 here with complaint of abnormal period this month. Patient on OCP for contraception.  taking as directed, but doing continuous use. This period started on 11/17/14 with normal duration but has continued off and on with spotting. Patient now on end of second week of new pack and had bleeding like a period again, but has decreased today, changing tampon once prior to visit this am..Periods prior were normal, no issues with 4-5 day duration. Denies pelvic pain or fatigue. Here for pelvic exam to "check to see if there is a problem or need to check OCP. Denies any other health issues other than stress at work with practice now a member of [employer].  POCT Hgb. 13.9  POCT UPT negative  O: Healthy WD,WN female Affect: Normal, orientation x 3 Skin: warm and dry Abdomen: soft, non tender Pelvic exam:EXTERNAL GENITALIA: normal appearing vulva with no masses, tenderness or lesions VAGINA: small amount of blood noted, normal appearance CERVIX: no lesions or cervical motion tenderness, blood noted from cervix UTERUS: enlarged 12-14 week size, slightly firm, non tender ADNEXA: no masses palpable, nontender and adnexa not palpable  A: Normal pelvic exam DUB with one cycle Enlarged uterus Contraception OCP   P: Discussed findings of normal pelvic exam except for enlarged uterus. Discussed possible etiology of fibroids, adenomyosis, or mass. Discussed ? Cause of change in cycle. Also discussed Thyroid and weight affect on cycle also. Discussed PUS evaluation for enlarged uterus. Patient agreeable. Patient will be called with insurance information and scheduled. Questions addressed. Discussed Motrin 600mg  every 6 hours for 48 hours with food to see if bleeding will subside. Complete pack of OCP and skip placebos and start next pack. Patient agreeable. Warning signs of excessive bleeding given and need to advise.    RV prn , as above

## 2014-12-06 NOTE — Progress Notes (Signed)
Reviewed personally.  M. Suzanne Tahjay Binion, MD.  

## 2014-12-10 ENCOUNTER — Telehealth: Payer: Self-pay | Admitting: Certified Nurse Midwife

## 2014-12-10 DIAGNOSIS — N926 Irregular menstruation, unspecified: Secondary | ICD-10-CM

## 2014-12-10 NOTE — Telephone Encounter (Signed)
Patient is ready to schedule her PUS appointment.  °

## 2014-12-11 NOTE — Telephone Encounter (Signed)
Spoke with patient. Advised patient of message as seen below from [place]. Patient is agreeable and verbalizes understanding. Would like to schedule PUS at this time. Appointment scheduled for 12/17/2014 at 3:30pm with Dr.Miller. Agreeable to date and time. Patient verbalized understanding of the [facility] appointment cancellation policy. Advised will need to cancel or reschedule within 72 business hours of appointment (3 business days) or will have $100.00 late cancellation fee placed to account. $150.00 for Sonohysterogram.   Routing to provider for final review. Patient agreeable to disposition. Will close encounter

## 2014-12-11 NOTE — Telephone Encounter (Signed)
Patient is calling back to speak with [place].

## 2014-12-11 NOTE — Telephone Encounter (Signed)
Left message for patient to call back. Pr $0

## 2014-12-17 ENCOUNTER — Ambulatory Visit (INDEPENDENT_AMBULATORY_CARE_PROVIDER_SITE_OTHER): Payer: 59

## 2014-12-17 ENCOUNTER — Ambulatory Visit (INDEPENDENT_AMBULATORY_CARE_PROVIDER_SITE_OTHER): Payer: 59 | Admitting: Obstetrics & Gynecology

## 2014-12-17 VITALS — BP 100/62 | Ht 60.0 in | Wt 155.0 lb

## 2014-12-17 DIAGNOSIS — N926 Irregular menstruation, unspecified: Secondary | ICD-10-CM

## 2014-12-17 DIAGNOSIS — N938 Other specified abnormal uterine and vaginal bleeding: Secondary | ICD-10-CM

## 2014-12-17 DIAGNOSIS — D251 Intramural leiomyoma of uterus: Secondary | ICD-10-CM

## 2014-12-17 DIAGNOSIS — Z3041 Encounter for surveillance of contraceptive pills: Secondary | ICD-10-CM

## 2014-12-17 MED ORDER — ALPRAZOLAM 0.25 MG PO TABS: 0.2500 mg | ORAL_TABLET | ORAL | Status: DC | PRN

## 2014-12-17 MED ORDER — LEVONORGEST-ETH ESTRAD 91-DAY 0.15-0.03 MG PO TABS: 1.0000 | ORAL_TABLET | Freq: Every day | ORAL | Status: DC

## 2014-12-17 NOTE — Progress Notes (Signed)
39 y.o.G1P1 MWF here for a pelvic ultrasound due to DUB.  Pt is on OCPs for contraception and reports she is taking them correctly.  She is using Alesse but takes them continuously for up to 12 weeks, skipping placebo pills.  She really like the every three months cycles.  Pt started a cycle 11/17/14, which was "normal" to start with but she has continued to have spotting off and on since then.  It is not heavy.  Denies cramping.    Patient's last menstrual period was 11/18/2014.  Sexually active:  yes  Contraception: oral contraceptives (estrogen/progesterone)  FINDINGS: UTERUS: 7.0 x 4.4. X 3.0cm with 2.0cm intramural fibroid, lsight encroachment on endometrium EMS: 3.19mm ADNEXA:   Left ovary 2.9 x 1.7 x 1.3cm   Right ovary 2.2 x 1.3 x 1.4cm CUL DE SAC: no free fluid noted  Images reviewed with pt and findings discussed.  Small fibroid noted.  Normal ovaries noted.  Thin endometrium noted.  Do not feel endometrial biopsy is warranted.  Upon asking, pt does feel OCPs have been changed from one generic to another.  No hypertension issues.  Feel can increase OCP to a 12 week pill to see if this will fix bleeding issues.  Rx given.  Pt will start immediatley if she desires.  Pt knows ot let me know if irregular bleeding continues or if restarts.  All questions answered.   Assessment:  DUB on current OCP regamen, taking Alesse continuously, fibroid  Plan: Change OCPs to [place].  RX to pharmacy.  Instructions given regarding pack and how to take pills.  All questions answered.  ~15 minutes spent with patient >50% of time was in face to face discussion of above.

## 2014-12-25 ENCOUNTER — Encounter: Payer: Self-pay | Admitting: Obstetrics & Gynecology

## 2014-12-29 ENCOUNTER — Telehealth: Payer: Self-pay

## 2014-12-29 NOTE — Telephone Encounter (Signed)
lmtcb-checking on patient and to schedule AEX after 07/14/15//kn

## 2015-01-25 NOTE — Telephone Encounter (Signed)
Lmtcb//kn 

## 2015-02-20 ENCOUNTER — Encounter (HOSPITAL_COMMUNITY): Payer: Self-pay | Admitting: Emergency Medicine

## 2015-02-20 ENCOUNTER — Emergency Department (HOSPITAL_COMMUNITY): Payer: 59

## 2015-02-20 ENCOUNTER — Emergency Department (HOSPITAL_COMMUNITY)
Admission: EM | Admit: 2015-02-20 | Discharge: 2015-02-20 | Disposition: A | Discharge status: Home / Self Care | Payer: 59 | Attending: Emergency Medicine | Admitting: Emergency Medicine

## 2015-02-20 DIAGNOSIS — S60222A Contusion of left hand, initial encounter: Secondary | ICD-10-CM | POA: Insufficient documentation

## 2015-02-20 DIAGNOSIS — W1789XA Other fall from one level to another, initial encounter: Secondary | ICD-10-CM | POA: Diagnosis not present

## 2015-02-20 DIAGNOSIS — Y9289 Other specified places as the place of occurrence of the external cause: Secondary | ICD-10-CM | POA: Diagnosis not present

## 2015-02-20 DIAGNOSIS — Z87891 Personal history of nicotine dependence: Secondary | ICD-10-CM | POA: Insufficient documentation

## 2015-02-20 DIAGNOSIS — Z79899 Other long term (current) drug therapy: Secondary | ICD-10-CM | POA: Diagnosis not present

## 2015-02-20 DIAGNOSIS — S99921A Unspecified injury of right foot, initial encounter: Secondary | ICD-10-CM | POA: Diagnosis present

## 2015-02-20 DIAGNOSIS — T148XXA Other injury of unspecified body region, initial encounter: Secondary | ICD-10-CM

## 2015-02-20 DIAGNOSIS — Y998 Other external cause status: Secondary | ICD-10-CM | POA: Diagnosis not present

## 2015-02-20 DIAGNOSIS — Y9301 Activity, walking, marching and hiking: Secondary | ICD-10-CM | POA: Insufficient documentation

## 2015-02-20 DIAGNOSIS — S99922A Unspecified injury of left foot, initial encounter: Secondary | ICD-10-CM | POA: Diagnosis not present

## 2015-02-20 DIAGNOSIS — W19XXXA Unspecified fall, initial encounter: Secondary | ICD-10-CM

## 2015-02-20 MED ORDER — OXYCODONE-ACETAMINOPHEN 5-325 MG PO TABS
1.0000 | ORAL_TABLET | Freq: Once | ORAL | Status: AC
  Administered 2015-02-20: 1 via ORAL
  Filled 2015-02-20: qty 1

## 2015-02-20 MED ORDER — OXYCODONE-ACETAMINOPHEN 5-325 MG PO TABS: 1.0000 | ORAL_TABLET | Freq: Four times a day (QID) | ORAL | Status: DC | PRN

## 2015-02-20 NOTE — ED Notes (Signed)
Pt.l stated, I fell through the attic and landed on my feet.  My feet hurt and my left wrist. No LOC. Pt. Ambulatory i the ED

## 2015-02-20 NOTE — Discharge Instructions (Signed)
Contusion °A contusion is a deep bruise. Contusions happen when an injury causes bleeding under the skin. Signs of bruising include pain, puffiness (swelling), and discolored skin. The contusion may turn blue, purple, or yellow. °HOME CARE  °· Put ice on the injured area. °¨ Put ice in a plastic bag. °¨ Place a towel between your skin and the bag. °¨ Leave the ice on for 15-20 minutes, 03-04 times a day. °· Only take medicine as told by your doctor. °· Rest the injured area. °· If possible, raise (elevate) the injured area to lessen puffiness. °GET HELP RIGHT AWAY IF:  °· You have more bruising or puffiness. °· You have pain that is getting worse. °· Your puffiness or pain is not helped by medicine. °MAKE SURE YOU:  °· Understand these instructions. °· Will watch your condition. °· Will get help right away if you are not doing well or get worse. °Document Released: 04/17/2008 Document Revised: 01/22/2012 Document Reviewed: 09/04/2011 °ExitCare® Patient Information ©2015 [employer]. This information is not intended to replace advice given to you by your health care provider. Make sure you discuss any questions you have with your health care provider. ° °

## 2015-02-20 NOTE — ED Provider Notes (Signed)
CSN: [id number]     Arrival date & time 02/20/15  1320 History   First MD Initiated Contact with Patient 02/20/15 1435     Chief Complaint  Patient presents with  . Fall  . Foot Pain  . Wrist Pain     (Consider location/radiation/quality/duration/timing/severity/associated sxs/prior Treatment) Patient is a 38 y.o. female presenting with fall, lower extremity pain, and wrist pain. The history is provided by the patient.  Fall This is a new problem. The current episode started less than 1 hour ago. Episode frequency: once. The problem has been resolved. Pertinent negatives include no chest pain, no abdominal pain, no headaches and no shortness of breath. The symptoms are aggravated by walking. Nothing relieves the symptoms. She has tried nothing for the symptoms. The treatment provided no relief.  Foot Pain Pertinent negatives include no chest pain, no abdominal pain, no headaches and no shortness of breath.  Wrist Pain Pertinent negatives include no chest pain, no abdominal pain, no headaches and no shortness of breath.    Past Medical History  Diagnosis Date  . Sleep disturbance, unspecified     "just not sleeping well sometimes"  . Headache(784.0) 04/17/12    "maybe once/wk"  . Abnormal Pap smear 2004    Hx of abnormal pap smear    Past Surgical History  Procedure Laterality Date  . Dilation and curettage of uterus  ~ 2002  . Cholecystectomy  04/17/2012    Procedure: LAPAROSCOPIC CHOLECYSTECTOMY WITH INTRAOPERATIVE CHOLANGIOGRAM;  Surgeon: Adin Hector, MD;  Location: [facility];  Service: General;  Laterality: N/A;  . 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 History  Substance Use Topics  . Smoking status: Former Smoker -- .5 years    Types: Cigarettes    Quit date: 04/13/1996  . Smokeless tobacco: Never Used     Comment: "just smoked when I drank"  . Alcohol Use: No     Comment: "quit alcohol age  16"   OB History    Gravida Para Term Preterm AB TAB SAB Ectopic Multiple Living   1 1 1       1      Review of Systems  Constitutional: Negative for fever and fatigue.  HENT: Negative for congestion and drooling.   Eyes: Negative for pain.  Respiratory: Negative for cough and shortness of breath.   Cardiovascular: Negative for chest pain.  Gastrointestinal: Negative for nausea, vomiting, abdominal pain and diarrhea.  Genitourinary: Negative for dysuria and hematuria.  Musculoskeletal: Negative for back pain, gait problem and neck pain.  Skin: Negative for color change.  Neurological: Negative for dizziness and headaches.  Hematological: Negative for adenopathy.  Psychiatric/Behavioral: Negative for behavioral problems.  All other systems reviewed and are negative.     Allergies  Review of patient's allergies indicates no known allergies.  Home Medications   Prior to Admission medications   Medication Sig Start Date End Date Taking? Authorizing Provider  ALPRAZolam (XANAX) 0.25 MG tablet Take 1 tablet (0.25 mg total) by mouth as needed for sleep. 12/17/14   Megan Salon, MD  ibuprofen (ADVIL,MOTRIN) 200 MG tablet Take 200-800 mg by mouth every 6 (six) hours as needed. Headache or pain    Historical Provider, MD  levonorgestrel-ethinyl estradiol (SEASONALE,INTROVALE,JOLESSA) 0.15-0.03 MG tablet Take 1 tablet by mouth daily. 12/17/14   Megan Salon, MD   BP 127/80 mmHg  Pulse  74  Temp(Src) 98.5 F (36.9 C) (Oral)  Resp 19  Ht 5' (1.524 m)  Wt 153 lb (69.4 kg)  BMI 29.88 kg/m2  SpO2 97%  LMP 11/21/2014 Physical Exam  Constitutional: She is oriented to person, place, and time. She appears well-developed and well-nourished.  HENT:  Head: Normocephalic.  Mouth/Throat: No oropharyngeal exudate.  Eyes: Conjunctivae and EOM are normal. Pupils are equal, round, and reactive to light.  Neck: Normal range of motion. Neck supple.  No vertebral tenderness noted.  Cardiovascular:  Normal rate, regular rhythm, normal heart sounds and intact distal pulses.  Exam reveals no gallop and no friction rub.   No murmur heard. Pulmonary/Chest: Effort normal and breath sounds normal. No respiratory distress. She has no wheezes. She exhibits no tenderness.  Abdominal: Soft. Bowel sounds are normal. There is no tenderness. There is no rebound and no guarding.  Musculoskeletal: She exhibits tenderness. She exhibits no edema.  Bilateral tenderness to palpation of the heels. Normal range of motion of ankles bilaterally.  Diffuse tenderness to palpation of the left elbow, left forearm, and left wrist. No significant scaphoid tenderness of the left hand.  She appears to have normal motor skills in the left hand.  Normal range of motion of the hips, knees, and ankles without pain.  2+ distal pulses diffusely.  Neurological: She is alert and oriented to person, place, and time.  Skin: Skin is warm and dry.  Psychiatric: She has a normal mood and affect. Her behavior is normal.  Nursing note and vitals reviewed.   ED Course  Procedures (including critical care time) Labs Review Labs Reviewed - No data to display  Imaging Review Dg Pelvis 1-2 Views  02/20/2015   CLINICAL DATA:  Fall.  EXAM: PELVIS - 1-2 VIEW  COMPARISON:  None.  FINDINGS: There is no evidence of pelvic fracture or diastasis. No pelvic bone lesions are seen.  IMPRESSION: Negative.   Electronically Signed   By: Kerby Moors M.D.   On: 02/20/2015 16:23   Dg Forearm Left  02/20/2015   CLINICAL DATA:  Acute left forearm pain after falling out of attic and into garage. Initial encounter.  EXAM: LEFT FOREARM - 2 VIEW  COMPARISON:  None.  FINDINGS: There is no evidence of fracture or other focal bone lesions. Foreign bodies are noted in the soft tissues of the proximal forearm consistent with history of glass from previous injury.  IMPRESSION: No fracture or dislocation is noted. Probable glass is noted in soft tissue of  proximal left forearm.   Electronically Signed   By: Marijo Conception, M.D.   On: 02/20/2015 16:19   Dg Hand Complete Left  02/20/2015   CLINICAL DATA:  Fall 10 feet through attic floor into garage.  EXAM: LEFT HAND - COMPLETE 3+ VIEW  COMPARISON:  None.  FINDINGS: There is no evidence of fracture or dislocation. There is no evidence of arthropathy or other focal bone abnormality. Soft tissues are unremarkable.  IMPRESSION: Negative.   Electronically Signed   By: Marin Olp M.D.   On: 02/20/2015 16:19   Dg Foot Complete Left  02/20/2015   CLINICAL DATA:  Fall  EXAM: LEFT FOOT - COMPLETE 3+ VIEW  COMPARISON:  None.  FINDINGS: There is no evidence of fracture or dislocation. There is no evidence of arthropathy or other focal bone abnormality. Soft tissues are unremarkable.  IMPRESSION: Negative.   Electronically Signed   By: Kerby Moors M.D.   On: 02/20/2015 16:43  Dg Foot Complete Right  02/20/2015   CLINICAL DATA:  Fall from at neck.  Landed on feet.  EXAM: RIGHT FOOT COMPLETE - 3+ VIEW  COMPARISON:  None.  FINDINGS: There is no evidence of fracture or dislocation. There is no evidence of arthropathy or other focal bone abnormality. Soft tissues are unremarkable.  IMPRESSION: Negative.   Electronically Signed   By: Kerby Moors M.D.   On: 02/20/2015 16:45     EKG Interpretation None      MDM   Final diagnoses:  Fall  Contusion    3:02 PM 39 y.o. female who presents after a fall. She states that she was walking in her attic above her garage when the ground gave way and she fell approximately 10 feet to the concrete floor of the garage. She states that she believes she landed on her feet and then tumbled. She denies hitting her head or loss of consciousness. She denies any headache, neck or back pain, or abdominal pain. She currently complains of left upper extremity and bilateral heel pain. She appears well on exam. Low suspicion for any serious traumatic injury. We'll get screening  imaging and pain control.  5:36 PM: I interpreted/reviewed the labs and/or imaging which were non-contributory.  The patient has foreign glass bodies from a previous injury noted on x-ray of forearm. She confirms that this is indeed from a previous injury. I have discussed the diagnosis/risks/treatment options with the patient and believe the pt to be eligible for discharge home to follow-up with her pcp as needed. Sling for comfort, frozen shoulder precautions given. We also discussed returning to the ED immediately if new or worsening sx occur. We discussed the sx which are most concerning (e.g., worsening pain, HA) that necessitate immediate return. Medications administered to the patient during their visit and any new prescriptions provided to the patient are listed below.  Medications given during this visit Medications  oxyCODONE-acetaminophen (PERCOCET/ROXICET) 5-325 MG per tablet 1 tablet (1 tablet Oral Given 02/20/15 1516)    New Prescriptions   OXYCODONE-ACETAMINOPHEN (PERCOCET) 5-325 MG PER TABLET    Take 1-2 tablets by mouth every 6 (six) hours as needed for moderate pain.     Pamella Pert, MD 02/20/15 1736

## 2015-02-26 ENCOUNTER — Telehealth: Payer: Self-pay | Admitting: Certified Nurse Midwife

## 2015-02-26 NOTE — Telephone Encounter (Signed)
Left message for patient to call back. Need to schedule follow-up PUS

## 2015-03-01 NOTE — Telephone Encounter (Signed)
Left message for patient to call back  

## 2015-03-04 NOTE — Telephone Encounter (Signed)
Left message for patient to call back  

## 2015-04-05 NOTE — Telephone Encounter (Signed)
Left detailed message, ok per DPR, to call back to schedule AEX and give update.//kn

## 2015-04-08 NOTE — Telephone Encounter (Signed)
Dr Sabra Heck, I have left patient several detailed messages to give update and to schedule AEX after 07/14/15. Please advise if I should send a letter or continue to call?//kn

## 2015-04-09 NOTE — Telephone Encounter (Signed)
She was only given one RX for Seasonale and 1RF so she will need appt for RFs.  Ok to stop calling and remove from your in-box.  Thanks.

## 2015-04-13 ENCOUNTER — Other Ambulatory Visit: Payer: Self-pay | Admitting: *Deleted

## 2015-04-13 NOTE — Telephone Encounter (Signed)
Medication refill request: xanax  Last AEX:  07/13/14 TL Next AEX: None  Last MMG (if hormonal medication request): None Refill authorized: 12/17/14 #30/1R. Today please advise.

## 2015-04-15 MED ORDER — ALPRAZOLAM 0.25 MG PO TABS
0.2500 mg | ORAL_TABLET | ORAL | Status: DC | PRN
Start: 2015-04-15 — End: 2016-12-15

## 2015-04-15 NOTE — Telephone Encounter (Signed)
Jazz faxed Rx to pharmacy today

## 2015-04-20 ENCOUNTER — Telehealth: Payer: Self-pay | Admitting: Obstetrics and Gynecology

## 2015-04-20 NOTE — Telephone Encounter (Signed)
Pt states her pharmacy told her they would fax forms to [facility] for her Xanax refills. Pt was calling for a followup.

## 2015-05-07 NOTE — Telephone Encounter (Signed)
This phone note was opened by a temporary staff member but the call was not routed. Routing to clinical staff be sure this has been addressed. This encounter will need to be closed if no follow up is needed please.

## 2015-05-07 NOTE — Telephone Encounter (Signed)
rx was faxed in by jasmine

## 2015-06-11 ENCOUNTER — Other Ambulatory Visit: Payer: Self-pay | Admitting: Obstetrics & Gynecology

## 2015-06-14 NOTE — Telephone Encounter (Signed)
Patient called back patient was supposed to call us and give a update of how she is doing on this [facility]. Patient says she's been doing good and much better. She has 2-3 days left and she needs refill, okay to send in #91 to last patient until she sees you on 07/15/15?

## 2015-06-14 NOTE — Telephone Encounter (Signed)
Medication refill request: Phyllis Morales  Last AEX:  07/13/14 with TL  Next AEX: 07/15/15 with JJ Last MMG (if hormonal medication request): none Refill authorized: #91  Left Message To Call Back

## 2015-07-13 ENCOUNTER — Other Ambulatory Visit: Payer: Self-pay | Admitting: Obstetrics and Gynecology

## 2015-07-13 NOTE — Telephone Encounter (Signed)
06/14/2015 #91/0 rfs was sent to [facility]/[address]-rx denied.

## 2015-07-15 ENCOUNTER — Ambulatory Visit: Payer: 59 | Admitting: Obstetrics and Gynecology

## 2015-07-15 ENCOUNTER — Ambulatory Visit (INDEPENDENT_AMBULATORY_CARE_PROVIDER_SITE_OTHER): Payer: 59 | Admitting: Obstetrics and Gynecology

## 2015-07-15 ENCOUNTER — Encounter: Payer: Self-pay | Admitting: Obstetrics and Gynecology

## 2015-07-15 VITALS — BP 110/68 | HR 72 | Resp 14 | Ht 60.0 in | Wt 155.0 lb

## 2015-07-15 DIAGNOSIS — Z124 Encounter for screening for malignant neoplasm of cervix: Secondary | ICD-10-CM | POA: Diagnosis not present

## 2015-07-15 DIAGNOSIS — Z01419 Encounter for gynecological examination (general) (routine) without abnormal findings: Secondary | ICD-10-CM | POA: Diagnosis not present

## 2015-07-15 DIAGNOSIS — R635 Abnormal weight gain: Secondary | ICD-10-CM | POA: Diagnosis not present

## 2015-07-15 DIAGNOSIS — F329 Major depressive disorder, single episode, unspecified: Secondary | ICD-10-CM

## 2015-07-15 DIAGNOSIS — F411 Generalized anxiety disorder: Secondary | ICD-10-CM | POA: Diagnosis not present

## 2015-07-15 DIAGNOSIS — Z0000 Encounter for general adult medical examination without abnormal findings: Secondary | ICD-10-CM

## 2015-07-15 DIAGNOSIS — F32A Depression, unspecified: Secondary | ICD-10-CM

## 2015-07-15 LAB — COMPREHENSIVE METABOLIC PANEL
ALBUMIN: 4.1 g/dL (ref 3.6–5.1)
ALT: 11 U/L (ref 6–29)
AST: 11 U/L (ref 10–30)
Alkaline Phosphatase: 43 U/L (ref 33–115)
BILIRUBIN TOTAL: 0.5 mg/dL (ref 0.2–1.2)
BUN: 12 mg/dL (ref 7–25)
CALCIUM: 9 mg/dL (ref 8.6–10.2)
CO2: 25 mmol/L (ref 20–31)
CREATININE: 0.92 mg/dL (ref 0.50–1.10)
Chloride: 102 mmol/L (ref 98–110)
Glucose, Bld: 86 mg/dL (ref 65–99)
Potassium: 4.4 mmol/L (ref 3.5–5.3)
SODIUM: 135 mmol/L (ref 135–146)
TOTAL PROTEIN: 7 g/dL (ref 6.1–8.1)

## 2015-07-15 LAB — LIPID PANEL
CHOLESTEROL: 206 mg/dL — AB (ref 125–200)
HDL: 42 mg/dL — ABNORMAL LOW (ref >=46)
LDL Cholesterol: 143 mg/dL — ABNORMAL HIGH (ref <130)
TRIGLYCERIDES: 105 mg/dL (ref <150)
Total CHOL/HDL Ratio: 4.9 Ratio (ref <=5.0)
VLDL: 21 mg/dL (ref <30)

## 2015-07-15 LAB — TSH: TSH: 1.732 u[IU]/mL (ref 0.350–4.500)

## 2015-07-15 MED ORDER — CITALOPRAM HYDROBROMIDE 20 MG PO TABS: 20.0000 mg | ORAL_TABLET | Freq: Every day | ORAL | Status: DC

## 2015-07-15 MED ORDER — LEVONORGEST-ETH ESTRAD 91-DAY 0.15-0.03 MG PO TABS
1.0000 | ORAL_TABLET | Freq: Every day | ORAL | Status: DC
Start: 2015-07-15 — End: 2016-09-20

## 2015-07-15 NOTE — Progress Notes (Signed)
Patient ID: Phyllis Morales, female   DOB: 11-20-1975, 39 y.o.   MRN: [id number] 39 y.o. G1P1001 MarriedCaucasianF here for annual exam. The patient is on seasonique and reports menses q 3 months x 4-5 days. Saturates a super tampon in 3 hours. No BTB. Sexually active, no pain. Desires pap today. She has a mammogram set up for today. She c/o weight gain and of being more emotional. She has tried exercise and weight watchers. She doesn't overeat. She has been under more stress at work and home. Sons last year of school.  She works at the [employer] of an [employer] office, they just switched to [facility] last December and are very busy. Her family is trying to buy a new house, can't sell the one they are in. Feels anxious and mildly depressed.   Patient's last menstrual period was 06/14/2015.          Sexually active: Yes.    The current method of family planning is OCP (estrogen/progesterone).    Exercising: No.  The patient does not participate in regular exercise at present. Smoker:  no  Health Maintenance: Pap:  07-13-14 WNL  History of abnormal Pap:  Yes- LEEP in 2004 MMG:  Scheduled today  Colonoscopy:  N/A BMD:   N/A TDaP:  2014   reports that she quit smoking about 19 years ago. Her smoking use included Cigarettes. She quit after .5 years of use. She has never used smokeless tobacco. She reports that she does not drink alcohol or use illicit drugs.  Past Medical History  Diagnosis Date  . Sleep disturbance, unspecified     "just not sleeping well sometimes"  . Headache(784.0) 04/17/12    "maybe once/wk"  . Abnormal Pap smear 2004    Hx of abnormal pap smear     Past Surgical History  Procedure Laterality Date  . Dilation and curettage of uterus  ~ 2002  . Cholecystectomy  04/17/2012    Procedure: LAPAROSCOPIC CHOLECYSTECTOMY WITH INTRAOPERATIVE CHOLANGIOGRAM;  Surgeon: Adin Hector, MD;  Location: [facility];  Service: General;  Laterality: N/A;  . 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 ALPRAZolam (XANAX) 0.25 MG tablet Take 1 tablet (0.25 mg total) by mouth as needed for sleep. 30 tablet 0  . ibuprofen (ADVIL,MOTRIN) 200 MG tablet Take 200-800 mg by mouth every 6 (six) hours as needed. Headache or pain    . levonorgestrel-ethinyl estradiol (JOLESSA) 0.15-0.03 MG tablet Take 1 tablet by mouth daily. 91 tablet 3  . citalopram (CELEXA) 20 MG tablet Take 1 tablet (20 mg total) by mouth daily. 30 tablet 1   No current facility-administered medications for this visit.    Family History  Problem Relation Age of Onset  . Hyperlipidemia Mother     ROS:  Pertinent items are noted in HPI.  Otherwise, a comprehensive ROS was negative.  Exam:   BP 110/68 mmHg  Pulse 72  Resp 14  Ht 5' (1.524 m)  Wt 155 lb (70.308 kg)  BMI 30.27 kg/m2  LMP 06/14/2015  Weight change: @WEIGHTCHANGE @ Height:   Height: 5' (152.4 cm)  Ht Readings from Last 3 Encounters:  07/15/15 5' (1.524 m)  02/20/15 5' (1.524 m)  12/17/14 5' (1.524 m)    General appearance: alert, cooperative and appears stated age Head: Normocephalic, without obvious abnormality, atraumatic Neck: no adenopathy,  supple, symmetrical, trachea midline and thyroid normal to inspection and palpation Lungs: clear to auscultation bilaterally Breasts: normal appearance, no masses or tenderness Heart: regular rate and rhythm Abdomen: soft, non-tender; bowel sounds normal; no masses,  no organomegaly Extremities: extremities normal, atraumatic, no cyanosis or edema Skin: Skin color, texture, turgor normal. No rashes or lesions Lymph nodes: Cervical, supraclavicular, and axillary nodes normal. No abnormal inguinal nodes palpated Neurologic: Grossly normal   Pelvic: External genitalia:  no lesions              Urethra:  normal appearing urethra with no masses, tenderness or lesions               Bartholins and Skenes: normal                 Vagina: normal appearing vagina with normal color and discharge, no lesions              Cervix: no lesions              Pap taken: Yes.   Bimanual Exam:  Uterus:  normal size, contour, position, consistency, mobility, non-tender and anteverted              Adnexa: normal adnexa and no mass, fullness, tenderness               Rectovaginal: Confirms               Anus:  normal sphincter tone, no lesions  Chaperone was present for exam.  A:  Well Woman with normal exam  H/O leep 2004, normal paps since then, she desires pap  Anxiety, depression  Weight gain  P:   Pap with hpv, we discussed spacing her paps out   Continue OCP's  TSH, vit D, CMP, fasting lipid profile  Start Celexa, f/u in 1 month

## 2015-07-15 NOTE — Patient Instructions (Signed)

## 2015-07-16 ENCOUNTER — Ambulatory Visit: Payer: Managed Care, Other (non HMO) | Admitting: Gynecology

## 2015-07-16 LAB — VITAMIN D 25 HYDROXY (VIT D DEFICIENCY, FRACTURES): Vit D, 25-Hydroxy: 56 ng/mL (ref 30–100)

## 2015-07-20 ENCOUNTER — Telehealth: Payer: Self-pay | Admitting: *Deleted

## 2015-07-20 LAB — IPS PAP TEST WITH HPV

## 2015-07-20 NOTE — Telephone Encounter (Signed)
Patient called back and I gave her lab results -eh

## 2015-07-20 NOTE — Telephone Encounter (Signed)
LMTC in regards to lab results -eh 

## 2015-07-20 NOTE — Telephone Encounter (Signed)
-----   Message from Salvadore Dom, MD sent at 07/16/2015 12:35 PM EDT ----- Please inform the patient that her lipid panel is slightly abnormal. She should be on a diet low in saturated fats, exercise regularly and have her lipids checked again next year. The rest of her blood work was normal. Her pap is pending.

## 2015-08-24 ENCOUNTER — Telehealth: Payer: Self-pay | Admitting: Obstetrics and Gynecology

## 2015-08-24 NOTE — Telephone Encounter (Signed)
Patient called and cancelled her 4 week recheck appointment with Dr. Talbert Nan for 08/26/15. She said she will call back to reschedule.

## 2015-08-26 ENCOUNTER — Ambulatory Visit: Payer: 59 | Admitting: Obstetrics and Gynecology

## 2015-08-27 ENCOUNTER — Other Ambulatory Visit: Payer: Self-pay | Admitting: Obstetrics and Gynecology

## 2015-08-27 NOTE — Telephone Encounter (Signed)
Patient needs a follow up appointment with Dr. Talbert Nan scheduled.  Please call to assist with this.  I will refill her Celexa for one month.

## 2015-08-27 NOTE — Telephone Encounter (Signed)
Medication refill request: Celexa  Last AEX:  07/15/15 JJ Next AEX: 07/20/16 JJ Last MMG (if hormonal medication request): None Refill authorized: 07/15/15 #30tabs / 1R. please advise.

## 2015-08-30 NOTE — Telephone Encounter (Signed)
Left Voicemail for pt to call back to make f/u appt. Informed she will need appt for further refills.

## 2015-09-14 MED ORDER — SERTRALINE HCL 25 MG PO TABS: ORAL_TABLET | ORAL | Status: DC

## 2015-09-14 NOTE — Telephone Encounter (Signed)
I've never had someone on the celexa c/o the "jumping", sounds like a twitching muscle. Lets try a different medication at a low dose, that should help with any WD she is having from the Celexa. If she develops any twitching on the new medication she should call. Try Zoloft at 25 mg, 1 tab po qd, if tolerating in two weeks increase to 2 tabs a day. #45, no refills. Please call in Anaprox DS for her headaches, 1 tab po q 12 hours prn. #30, 1 refill. She can't take any over the counter NSAIDS with the Anaprox. If her headache persists with the Anaprox and is the worst headache she has had, she should f/u at urgent care or with her primary.  Schedule a f/u appointment in 1 month.

## 2015-09-14 NOTE — Telephone Encounter (Signed)
Spoke with patient. Patient states that she was started on Celexa 20 mg around 1 month ago. Patient was due to have a one month follow up with Dr.Jertson,but was unable to get off work. States that since she started the Celexa she was having "jumping" on the right side of her face. 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 So I stopped the Celexa 7 days ago." Has not had any further facial twitching. Now has had a headache for 7 days. Advised with Celexa you are not supposed to abruptly stop the medication due to side effects with abruptly stopping. Patient states that her anxiety has returned since stopping the Celexa. "I do not know what to do because I do not want to take it if it is causing my face to jump, but it was helping my other symptoms." States she is taking 600 mg of motrin and aleve 3-4 times a day for her headache with little relief. Is concerned about how much she is having to take. Patient is requesting further recommendations from [facility] regarding anxiety medication and rx for stronger medication for headache relief so she isn't having to take it so often. Advised I will speak with Dr.Jertson and return call.

## 2015-09-14 NOTE — Telephone Encounter (Signed)
Spoke with patient. Advised of message as seen below from [place]. Patient is agreeable and verbalizes understanding. Rx for Zoloft 25 mg take one tablet po qd for two weeks, increase to two tablets daily after two weeks #45 0RF sent to [facility] per patient's request. Rx for Anaprox DS take one tablet every 12 hours prn #30 1RF called into [facility] outpatient pharmacy. Patient is aware to call with side effects with Zoloft. If headache persists and is the worst headache she has had aware she will need to see her PCP or urgent care. 1 month follow up scheduled for 12/5 at 12:45 pm with Dr.Jertson. Agreeable to date and time.  Routing to provider for final review. Patient agreeable to disposition. Will close encounter.

## 2015-09-14 NOTE — Telephone Encounter (Addendum)
Patient is asking to talk with Dr.Jertson's nurse before scheduling her 4 week reck appointment. Last seen 07/15/15. Please call [phone].

## 2015-09-17 ENCOUNTER — Telehealth: Payer: Self-pay | Admitting: Obstetrics and Gynecology

## 2015-09-17 NOTE — Telephone Encounter (Signed)
Left patient a message to call back to reschedule a future appointment for a 1 month recheck on 10/18/15 that was cancelled by the provider.

## 2015-09-24 NOTE — Telephone Encounter (Signed)
Left patient a message to call back to reschedule a future appointment that was cancelled by the provider. °

## 2015-10-18 ENCOUNTER — Ambulatory Visit: Payer: 59 | Admitting: Obstetrics and Gynecology

## 2015-10-25 ENCOUNTER — Ambulatory Visit: Payer: 59 | Admitting: Obstetrics and Gynecology

## 2015-10-25 ENCOUNTER — Ambulatory Visit (INDEPENDENT_AMBULATORY_CARE_PROVIDER_SITE_OTHER): Payer: 59 | Admitting: Obstetrics and Gynecology

## 2015-10-25 ENCOUNTER — Encounter: Payer: Self-pay | Admitting: Obstetrics and Gynecology

## 2015-10-25 VITALS — BP 118/62 | HR 92 | Resp 20 | Ht 61.5 in | Wt 166.0 lb

## 2015-10-25 DIAGNOSIS — N393 Stress incontinence (female) (male): Secondary | ICD-10-CM | POA: Diagnosis not present

## 2015-10-25 DIAGNOSIS — F418 Other specified anxiety disorders: Secondary | ICD-10-CM | POA: Diagnosis not present

## 2015-10-25 MED ORDER — SERTRALINE HCL 50 MG PO TABS: ORAL_TABLET | ORAL | Status: DC

## 2015-10-25 NOTE — Addendum Note (Signed)
Addended by: Dorothy Spark on: 10/25/2015 04:02 PM   Modules accepted: Orders

## 2015-10-25 NOTE — Progress Notes (Signed)
GYNECOLOGY  VISIT   HPI: 39 y.o.   Married  Caucasian  female   G1P1001 with Patient's last menstrual period was 06/14/2015 (approximate).   here for   Medication check. The patient was started on Celexa in 9/16, had facial twitching, stopped it and got a bad headache. She then started on Zoloft, she has been on the Zoloft for a month and is feeling well, no depression and no anxiety. No side effects. No sexual dysfunction. She c/o GSI, up to 2-3 x a day with valsalva. Worse with a cough. Always small amounts, seems worse in the last few months. Went from a few x a week to daily. No urinary frequency, urgency or dysuria.   GYNECOLOGIC HISTORY: Patient's last menstrual period was 06/14/2015 (approximate). Contraception:Pill  Menopausal hormone therapy: None        OB History    Gravida Para Term Preterm AB TAB SAB Ectopic Multiple Living   1 1 1       1          Patient Active Problem List   Diagnosis Date Noted  . H/O LEEP 07/13/2014  . Gallstones 05/07/2012    Past Medical History  Diagnosis Date  . Sleep disturbance, unspecified     "just not sleeping well sometimes"  . Headache(784.0) 04/17/12    "maybe once/wk"  . Abnormal Pap smear 2004    Hx of abnormal pap smear   . Weight gain     Past Surgical History  Procedure Laterality Date  . Dilation and curettage of uterus  ~ 2002  . Cholecystectomy  04/17/2012    Procedure: LAPAROSCOPIC CHOLECYSTECTOMY WITH INTRAOPERATIVE CHOLANGIOGRAM;  Surgeon: Adin Hector, MD;  Location: [place];  Service: General;  Laterality: N/A;  . 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 ALPRAZolam (XANAX) 0.25 MG tablet Take 1 tablet (0.25 mg total) by mouth as needed for sleep. 30 tablet 0  . ibuprofen (ADVIL,MOTRIN) 200 MG tablet Take 200-800 mg by mouth every 6 (six) hours as needed. Headache or pain    .  levonorgestrel-ethinyl estradiol (JOLESSA) 0.15-0.03 MG tablet Take 1 tablet by mouth daily. 91 tablet 3  . sertraline (ZOLOFT) 25 MG tablet Take 1 tablet per day for 2 weeks. After 2 weeks increase to 2 tablets per day. 45 tablet 0  . citalopram (CELEXA) 20 MG tablet TAKE 1 TABLET (20 MG TOTAL) BY MOUTH DAILY. (Patient not taking: Reported on 10/25/2015) 30 tablet 0   No current facility-administered medications for this visit.     ALLERGIES: Review of patient's allergies indicates no known allergies.  Family History  Problem Relation Age of Onset  . Hyperlipidemia Mother     Social History   Social History  . Marital Status: Married    Spouse Name: N/A  . Number of Children: N/A  . Years of Education: N/A   Occupational History  . Not on file.   Social History Main Topics  . Smoking status: Former Smoker -- .5 years    Types: Cigarettes    Quit date: 04/13/1996  . Smokeless tobacco: Never Used     Comment: "just smoked when I drank"  . Alcohol Use: No     Comment: "quit alcohol age 6"  . Drug Use: No  . Sexual Activity:    Partners: Male    Birth Control/  Protection: Pill   Other Topics Concern  . Not on file   Social History Narrative    Review of Systems  Genitourinary:       Urination when sneezing/coughing.  All other systems reviewed and are negative.   PHYSICAL EXAMINATION:    BP 118/62 mmHg  Pulse 92  Resp 20  Ht 5' 1.5" (1.562 m)  Wt 166 lb (75.297 kg)  BMI 30.86 kg/m2  LMP 06/14/2015 (Approximate)    General appearance: alert, cooperative and appears stated age  ASSESSMENT Depression/Anxiety, better on the Zoloft, tolerating it well Worsening GSI    PLAN Continue Zoloft She will return to give ccua will send for a ua, c&s Discussed kegels, information given, discussed physical therapy and surgery   An After Visit Summary was printed and given to the patient.  15 minutes face to face time of which over 50% was spent in counseling.

## 2015-10-25 NOTE — Patient Instructions (Signed)
Kegel Exercises  The goal of Kegel exercises is to isolate and exercise your pelvic floor muscles. These muscles act as a hammock that supports the rectum, vagina, small intestine, and uterus. As the muscles weaken, the hammock sags and these organs are displaced from their normal positions. Kegel exercises can strengthen your pelvic floor muscles and help you to improve bladder and bowel control, improve sexual response, and help reduce many problems and some discomfort during pregnancy. Kegel exercises can be done anywhere and at any time.  HOW TO PERFORM KEGEL EXERCISES  1. Locate your pelvic floor muscles. To do this, squeeze (contract) the muscles that you use when you try to stop the flow of urine. You will feel a tightness in the vaginal area (women) and a tight lift in the rectal area (men and women).  2. When you begin, contract your pelvic muscles tight for 2-5 seconds, then relax them for 2-5 seconds. This is one set. Do 4-5 sets with a short pause in between.  3. Contract your pelvic muscles for 8-10 seconds, then relax them for 8-10 seconds. Do 4-5 sets. If you cannot contract your pelvic muscles for 8-10 seconds, try 5-7 seconds and work your way up to 8-10 seconds. Your goal is 4-5 sets of 10 contractions each day.  Keep your stomach, buttocks, and legs relaxed during the exercises. Perform sets of both short and long contractions. Vary your positions. Perform these contractions 3-4 times per day. Perform sets while you are:    · Lying in bed in the morning.  · Standing at lunch.  · Sitting in the late afternoon.  · Lying in bed at night.   You should do 40-50 contractions per day. Do not perform more Kegel exercises per day than recommended. Overexercising can cause muscle fatigue. Continue these exercises for for at least 15-20 weeks or as directed by your caregiver.     This information is not intended to replace advice given to you by your health care provider. Make sure you discuss any questions  you have with your health care provider.     Document Released: 10/16/2012 Document Revised: 11/20/2014 Document Reviewed: 10/16/2012  Elsevier Interactive Patient Education ©2016 Elsevier Inc.

## 2015-10-26 ENCOUNTER — Other Ambulatory Visit: Payer: 59

## 2015-10-26 ENCOUNTER — Telehealth: Payer: Self-pay | Admitting: Obstetrics and Gynecology

## 2015-10-26 NOTE — Telephone Encounter (Signed)
Patient called and cancelled her appointment to recheck her urine today due to being in the process of moving. She will call back to reschedule once she gets back to work and checks her schedule.

## 2015-11-02 NOTE — Telephone Encounter (Signed)
This patient has not called back to reschedule. Okay for provider to close encounter or is further follow up necessary?

## 2015-12-06 MED FILL — PHENTERMINE 37.5 MG TABLET: 37.5 | 21 days supply | Qty: 21 | Fill #0

## 2015-12-07 MED FILL — JOLESSA 0.15 MG-0.03 MG TAB: 0.15-0.03 | 90 days supply | Qty: 91 | Fill #1

## 2015-12-28 DIAGNOSIS — E669 Obesity, unspecified: Secondary | ICD-10-CM | POA: Diagnosis not present

## 2015-12-28 DIAGNOSIS — G44229 Chronic tension-type headache, not intractable: Secondary | ICD-10-CM | POA: Diagnosis not present

## 2015-12-28 MED FILL — CYCLOBENZAPRINE 10 MG TAB: 10 | 20 days supply | Qty: 60 | Fill #0

## 2015-12-28 MED FILL — NAPROXEN 500 MG TABLET: 500 | 30 days supply | Qty: 60 | Fill #0

## 2015-12-29 MED FILL — PHENTERMINE 37.5 MG TABLET: 37.5 | 21 days supply | Qty: 21 | Fill #0

## 2016-01-27 MED FILL — TOPIRAMATE ER 50 MG CAPSULE: 50 | 30 days supply | Qty: 30 | Fill #0

## 2016-01-27 MED FILL — PHENTERMINE 37.5 MG TABLET: 37.5 | 30 days supply | Qty: 30 | Fill #0

## 2016-03-14 MED FILL — JOLESSA 0.15 MG-0.03 MG TAB: 0.15-0.03 | 90 days supply | Qty: 91 | Fill #2

## 2016-05-25 MED FILL — CYCLOBENZAPRINE 10 MG TAB: 10 | 20 days supply | Qty: 60 | Fill #0

## 2016-06-06 MED FILL — JOLESSA 0.15 MG-0.03 MG TAB: 0.15-0.03 | 90 days supply | Qty: 91 | Fill #3

## 2016-07-20 ENCOUNTER — Ambulatory Visit: Payer: 59 | Admitting: Obstetrics and Gynecology

## 2016-09-14 ENCOUNTER — Ambulatory Visit (INDEPENDENT_AMBULATORY_CARE_PROVIDER_SITE_OTHER): Payer: BLUE CROSS/BLUE SHIELD | Admitting: Certified Nurse Midwife

## 2016-09-14 ENCOUNTER — Encounter: Payer: Self-pay | Admitting: Certified Nurse Midwife

## 2016-09-14 ENCOUNTER — Ambulatory Visit: Payer: 59 | Admitting: Certified Nurse Midwife

## 2016-09-14 VITALS — BP 114/64 | HR 68 | Resp 16 | Ht 64.5 in | Wt 165.0 lb

## 2016-09-14 DIAGNOSIS — F419 Anxiety disorder, unspecified: Secondary | ICD-10-CM | POA: Diagnosis not present

## 2016-09-14 DIAGNOSIS — Z124 Encounter for screening for malignant neoplasm of cervix: Secondary | ICD-10-CM

## 2016-09-14 DIAGNOSIS — Z01419 Encounter for gynecological examination (general) (routine) without abnormal findings: Secondary | ICD-10-CM | POA: Diagnosis not present

## 2016-09-14 DIAGNOSIS — Z0000 Encounter for general adult medical examination without abnormal findings: Secondary | ICD-10-CM | POA: Diagnosis not present

## 2016-09-14 DIAGNOSIS — Z3041 Encounter for surveillance of contraceptive pills: Secondary | ICD-10-CM

## 2016-09-14 LAB — CBC
HEMATOCRIT: 41.2 % (ref 35.0–45.0)
Hemoglobin: 13.6 g/dL (ref 11.7–15.5)
MCH: 31.4 pg (ref 27.0–33.0)
MCHC: 33 g/dL (ref 32.0–36.0)
MCV: 95.2 fL (ref 80.0–100.0)
MPV: 10.5 fL (ref 7.5–12.5)
PLATELETS: 314 10*3/uL (ref 140–400)
RBC: 4.33 MIL/uL (ref 3.80–5.10)
RDW: 13.6 % (ref 11.0–15.0)
WBC: 6 10*3/uL (ref 3.8–10.8)

## 2016-09-14 LAB — POCT URINALYSIS DIPSTICK
Bilirubin, UA: NEGATIVE
Glucose, UA: NEGATIVE
KETONES UA: NEGATIVE
Leukocytes, UA: NEGATIVE
Nitrite, UA: NEGATIVE
PH UA: 5
PROTEIN UA: NEGATIVE
UROBILINOGEN UA: NEGATIVE

## 2016-09-14 LAB — HEMOGLOBIN A1C
HEMOGLOBIN A1C: 5.4 % (ref <5.7)
Mean Plasma Glucose: 108 mg/dL

## 2016-09-14 LAB — TSH: TSH: 1.25 mIU/L

## 2016-09-14 MED ORDER — SERTRALINE HCL 50 MG PO TABS: ORAL_TABLET | ORAL | 1 refills | Status: DC

## 2016-09-14 NOTE — Patient Instructions (Signed)
EXERCISE AND DIET:  We recommended that you start or continue a regular exercise program for good health. Regular exercise means any activity that makes your heart beat faster and makes you sweat.  We recommend exercising at least 30 minutes per day at least 3 days a week, preferably 4 or 5.  We also recommend a diet low in fat and sugar.  Inactivity, poor dietary choices and obesity can cause diabetes, heart attack, stroke, and kidney damage, among others.    ALCOHOL AND SMOKING:  Women should limit their alcohol intake to no more than 7 drinks/beers/glasses of wine (combined, not each!) per week. Moderation of alcohol intake to this level decreases your risk of breast cancer and liver damage. And of course, no recreational drugs are part of a healthy lifestyle.  And absolutely no smoking or even second hand smoke. Most people know smoking can cause heart and lung diseases, but did you know it also contributes to weakening of your bones? Aging of your skin?  Yellowing of your teeth and nails?  CALCIUM AND VITAMIN D:  Adequate intake of calcium and Vitamin D are recommended.  The recommendations for exact amounts of these supplements seem to change often, but generally speaking 600 mg of calcium (either carbonate or citrate) and 800 units of Vitamin D per day seems prudent. Certain women may benefit from higher intake of Vitamin D.  If you are among these women, your doctor will have told you during your visit.    PAP SMEARS:  Pap smears, to check for cervical cancer or precancers,  have traditionally been done yearly, although recent scientific advances have shown that most women can have pap smears less often.  However, every woman still should have a physical exam from her gynecologist every year. It will include a breast check, inspection of the vulva and vagina to check for abnormal growths or skin changes, a visual exam of the cervix, and then an exam to evaluate the size and shape of the uterus and  ovaries.  And after 42 years of age, a rectal exam is indicated to check for rectal cancers. We will also provide age appropriate advice regarding health maintenance, like when you should have certain vaccines, screening for sexually transmitted diseases, bone density testing, colonoscopy, mammograms, etc.   MAMMOGRAMS:  All women over 40 years old should have a yearly mammogram. Many facilities now offer a "3D" mammogram, which may cost around $50 extra out of pocket. If possible,  we recommend you accept the option to have the 3D mammogram performed.  It both reduces the number of women who will be called back for extra views which then turn out to be normal, and it is better than the routine mammogram at detecting truly abnormal areas.    COLONOSCOPY:  Colonoscopy to screen for colon cancer is recommended for all women at age 50.  We know, you hate the idea of the prep.  We agree, BUT, having colon cancer and not knowing it is worse!!  Colon cancer so often starts as a polyp that can be seen and removed at colonscopy, which can quite literally save your life!  And if your first colonoscopy is normal and you have no family history of colon cancer, most women don't have to have it again for 10 years.  Once every ten years, you can do something that may end up saving your life, right?  We will be happy to help you get it scheduled when you are ready.    Be sure to check your insurance coverage so you understand how much it will cost.  It may be covered as a preventative service at no cost, but you should check your particular policy.      Exercising to Lose Weight Exercising can help you to lose weight. In order to lose weight through exercise, you need to do vigorous-intensity exercise. You can tell that you are exercising with vigorous intensity if you are breathing very hard and fast and cannot hold a conversation while exercising. Moderate-intensity exercise helps to maintain your current weight. You can  tell that you are exercising at a moderate level if you have a higher heart rate and faster breathing, but you are still able to hold a conversation. HOW OFTEN SHOULD I EXERCISE? Choose an activity that you enjoy and set realistic goals. Your health care provider can help you to make an activity plan that works for you. Exercise regularly as directed by your health care provider. This may include:  Doing resistance training twice each week, such as:  Push-ups.  Sit-ups.  Lifting weights.  Using resistance bands.  Doing a given intensity of exercise for a given amount of time. Choose from these options:  150 minutes of moderate-intensity exercise every week.  75 minutes of vigorous-intensity exercise every week.  A mix of moderate-intensity and vigorous-intensity exercise every week. Children, pregnant women, people who are out of shape, people who are overweight, and older adults may need to consult a health care provider for individual recommendations. If you have any sort of medical condition, be sure to consult your health care provider before starting a new exercise program. WHAT ARE SOME ACTIVITIES THAT CAN HELP ME TO LOSE WEIGHT?   Walking at a rate of at least 4.5 miles an hour.  Jogging or running at a rate of 5 miles per hour.  Biking at a rate of at least 10 miles per hour.  Lap swimming.  Roller-skating or in-line skating.  Cross-country skiing.  Vigorous competitive sports, such as football, basketball, and soccer.  Jumping rope.  Aerobic dancing. HOW CAN I BE MORE ACTIVE IN MY DAY-TO-DAY ACTIVITIES?  Use the stairs instead of the elevator.  Take a walk during your lunch break.  If you drive, park your car farther away from work or school.  If you take public transportation, get off one stop early and walk the rest of the way.  Make all of your phone calls while standing up and walking around.  Get up, stretch, and walk around every 30 minutes  throughout the day. WHAT GUIDELINES SHOULD I FOLLOW WHILE EXERCISING?  Do not exercise so much that you hurt yourself, feel dizzy, or get very short of breath.  Consult your health care provider prior to starting a new exercise program.  Wear comfortable clothes and shoes with good support.  Drink plenty of water while you exercise to prevent dehydration or heat stroke. Body water is lost during exercise and must be replaced.  Work out until you breathe faster and your heart beats faster.   This information is not intended to replace advice given to you by your health care provider. Make sure you discuss any questions you have with your health care provider.   Document Released: 12/02/2010 Document Revised: 11/20/2014 Document Reviewed: 04/02/2014 Elsevier Interactive Patient Education [employer].

## 2016-09-14 NOTE — Progress Notes (Signed)
40 y.o. G20P1001 Married  Caucasian Fe here for annual exam.Periods normal with OCP use. No issues over last year, except weight gain and migraine headache occurrence.  Patient working on weight loss with weight watchers. Has not seen any weight loss. Always had headaches, but the past few have felt muscular in origin. No auras ever. Uses Advil and Tylenol with some relief. Has MRI scheduled to evaluate headache, saw an orthopedic, who ordered.  Would like to be back on Zoloft for anxiety again. Felt so much better than I do now, especially with new job stressors. Screening labs desired. No other health issues today.  Patient's last menstrual period was 06/13/2016 (exact date).          Sexually active: Yes.    The current method of family planning is OCP (estrogen/progesterone).    Exercising: No.  exercise Smoker:  no  Health Maintenance: Pap:  07-15-15 neg HPV HR neg hx of LEEP 2004 MMG:  2016 negative per patient, has scheduled for 2017 Colonoscopy: none BMD:   none TDaP:  2014 Shingles: no Pneumonia: no Hep C and HIV: not done Labs: rbc-tr Self breast exam: not done   reports that she has never smoked. She has never used smokeless tobacco. She reports that she does not drink alcohol or use drugs.  Past Medical History:  Diagnosis Date  . Abnormal Pap smear 2004   Hx of abnormal pap smear   . Headache(784.0) 04/17/12   "maybe once/wk"  . Migraines   . Sleep disturbance, unspecified    "just not sleeping well sometimes"  . Weight gain     Past Surgical History:  Procedure Laterality Date  . 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 CHOLECYSTECTOMY  04/17/2012   Procedure: LAPAROSCOPIC CHOLECYSTECTOMY WITH INTRAOPERATIVE CHOLANGIOGRAM;  Surgeon: Adin Hector, MD;  Location: [place];  Service: General;  Laterality: N/A;  . DILATION AND CURETTAGE OF UTERUS  ~ 2002    Current Outpatient Prescriptions  Medication Sig Dispense Refill   . Acetaminophen (TYLENOL PO) Take by mouth daily.    Marland Kitchen ALPRAZolam (XANAX) 0.25 MG tablet Take 1 tablet (0.25 mg total) by mouth as needed for sleep. 30 tablet 0  . ibuprofen (ADVIL,MOTRIN) 200 MG tablet Take 200-800 mg by mouth every 6 (six) hours as needed. Headache or pain    . levonorgestrel-ethinyl estradiol (JOLESSA) 0.15-0.03 MG tablet Take 1 tablet by mouth daily. 91 tablet 3  . sertraline (ZOLOFT) 50 MG tablet Take 1 tablet per day 90 tablet 2   No current facility-administered medications for this visit.     Family History  Problem Relation Age of Onset  . Hyperlipidemia Mother     ROS:  Pertinent items are noted in HPI.  Otherwise, a comprehensive ROS was negative.  Exam:   BP 114/64   Pulse 68   Resp 16   Ht 5' 4.5" (1.638 m)   Wt 165 lb (74.8 kg)   LMP 06/13/2016 (Exact Date)   BMI 27.88 kg/m  Height: 5' 4.5" (163.8 cm) Ht Readings from Last 3 Encounters:  09/14/16 5' 4.5" (1.638 m)  10/25/15 5' 1.5" (1.562 m)  07/15/15 5' (1.524 m)    General appearance: alert, cooperative and appears stated age Head: Normocephalic, without obvious abnormality, atraumatic Neck: no adenopathy, supple, symmetrical, trachea midline and thyroid normal to inspection and palpation Lungs: clear to auscultation bilaterally Breasts: normal appearance, no masses or  tenderness, No nipple retraction or dimpling, No nipple discharge or bleeding, No axillary or supraclavicular adenopathy Heart: regular rate and rhythm Abdomen: soft, non-tender; no masses,  no organomegaly Extremities: extremities normal, atraumatic, no cyanosis or edema Skin: Skin color, texture, turgor normal. No rashes or lesions Lymph nodes: Cervical, supraclavicular, and axillary nodes normal. No abnormal inguinal nodes palpated Neurologic: Grossly normal   Pelvic: External genitalia:  no lesions              Urethra:  normal appearing urethra with no masses, tenderness or lesions              Bartholin's and  Skene's: normal                 Vagina: normal appearing vagina with normal color and discharge, no lesions              Cervix: no cervical motion tenderness, no lesions and normal appearane              Pap taken: no cervical motion tenderness, no lesions and normal appearance Bimanual Exam:  Uterus:  normal size, contour, position, consistency, mobility, non-tender              Adnexa: normal adnexa and no mass, fullness, tenderness               Rectovaginal: Confirms               Anus:  normal sphincter tone, no lesions  Chaperone present: yes  A:  Well Woman with normal exam  Contraception OCP desired  Anxiety with previous Zoloft use, desires again  Weight gain working on weight loss at present  Screening labs   P:   Reviewed health and wellness pertinent to exam  Rx Jolessa see order instructions  Rx Zoloft see order with instructions and warning signs and symptoms discussed. Needs to advise if occurs.  Continue Weight Watchers program, but add lean protein to diet.  Lab: CMP,Lipid panel, TSH, Vitamin D Hgb A1-C,CBC  Pap smear as above with HPV reflex   counseled on breast self exam, mammography screening, adequate intake of calcium and vitamin D, diet and exercise  return annually or prn  An After Visit Summary was printed and given to the patient.

## 2016-09-14 NOTE — Progress Notes (Deleted)
40 y.o. G62P1001 Married  Caucasian Fe here for annual exam.    No LMP recorded.          Sexually active: {yes no:314532}  The current method of family planning is {contraception:315051}.    Exercising: {yes no:314532}  {types:19826} Smoker:  {YES NO:22349}  Health Maintenance: Pap:  07-15-15 neg HPV HR neg, hx of LEEP MMG:  *** Colonoscopy:  none BMD:   none TDaP:  2014 Shingles: no Pneumonia: no Hep C and HIV: *** Labs:  Self breast exam:   reports that she quit smoking about 20 years ago. Her smoking use included Cigarettes. She quit after 0.50 years of use. She has never used smokeless tobacco. She reports that she does not drink alcohol or use drugs.  Past Medical History:  Diagnosis Date  . Abnormal Pap smear 2004   Hx of abnormal pap smear   . Headache(784.0) 04/17/12   "maybe once/wk"  . Sleep disturbance, unspecified    "just not sleeping well sometimes"  . Weight gain     Past Surgical History:  Procedure Laterality Date  . 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 CHOLECYSTECTOMY  04/17/2012   Procedure: LAPAROSCOPIC CHOLECYSTECTOMY WITH INTRAOPERATIVE CHOLANGIOGRAM;  Surgeon: Adin Hector, MD;  Location: [place];  Service: General;  Laterality: N/A;  . DILATION AND CURETTAGE OF UTERUS  ~ 2002    Current Outpatient Prescriptions  Medication Sig Dispense Refill  . ALPRAZolam (XANAX) 0.25 MG tablet Take 1 tablet (0.25 mg total) by mouth as needed for sleep. 30 tablet 0  . ibuprofen (ADVIL,MOTRIN) 200 MG tablet Take 200-800 mg by mouth every 6 (six) hours as needed. Headache or pain    . levonorgestrel-ethinyl estradiol (JOLESSA) 0.15-0.03 MG tablet Take 1 tablet by mouth daily. 91 tablet 3  . sertraline (ZOLOFT) 50 MG tablet Take 1 tablet per day 90 tablet 2   No current facility-administered medications for this visit.     Family History  Problem Relation Age of Onset  . Hyperlipidemia Mother     ROS:   Pertinent items are noted in HPI.  Otherwise, a comprehensive ROS was negative.  Exam:   There were no vitals taken for this visit.   Ht Readings from Last 3 Encounters:  10/25/15 5' 1.5" (1.562 m)  07/15/15 5' (1.524 m)  02/20/15 5' (1.524 m)    General appearance: alert, cooperative and appears stated age Head: Normocephalic, without obvious abnormality, atraumatic Neck: no adenopathy, supple, symmetrical, trachea midline and thyroid {EXAM; THYROID:18604} Lungs: clear to auscultation bilaterally Breasts: {Exam; breast:13139::"normal appearance, no masses or tenderness"} Heart: regular rate and rhythm Abdomen: soft, non-tender; no masses,  no organomegaly Extremities: extremities normal, atraumatic, no cyanosis or edema Skin: Skin color, texture, turgor normal. No rashes or lesions Lymph nodes: Cervical, supraclavicular, and axillary nodes normal. No abnormal inguinal nodes palpated Neurologic: Grossly normal   Pelvic: External genitalia:  no lesions              Urethra:  normal appearing urethra with no masses, tenderness or lesions              Bartholin's and Skene's: normal                 Vagina: normal appearing vagina with normal color and discharge, no lesions              Cervix: {exam; cervix:14595}  Pap taken: {yes no:314532} Bimanual Exam:  Uterus:  {exam; uterus:12215}              Adnexa: {exam; adnexa:12223}               Rectovaginal: Confirms               Anus:  normal sphincter tone, no lesions  Chaperone present: ***  A:  Well Woman with normal exam  P:   Reviewed health and wellness pertinent to exam  Pap smear as above  {plan; gyn:5269::"mammogram","pap smear","return annually or prn"}  An After Visit Summary was printed and given to the patient.

## 2016-09-15 LAB — COMPREHENSIVE METABOLIC PANEL
ALBUMIN: 3.9 g/dL (ref 3.6–5.1)
ALT: 13 U/L (ref 6–29)
AST: 14 U/L (ref 10–30)
Alkaline Phosphatase: 38 U/L (ref 33–115)
BUN: 10 mg/dL (ref 7–25)
CALCIUM: 9 mg/dL (ref 8.6–10.2)
CO2: 21 mmol/L (ref 20–31)
Chloride: 105 mmol/L (ref 98–110)
Creat: 0.86 mg/dL (ref 0.50–1.10)
Glucose, Bld: 83 mg/dL (ref 65–99)
Potassium: 4.2 mmol/L (ref 3.5–5.3)
Sodium: 137 mmol/L (ref 135–146)
TOTAL PROTEIN: 6.8 g/dL (ref 6.1–8.1)
Total Bilirubin: 0.2 mg/dL (ref 0.2–1.2)

## 2016-09-15 LAB — LIPID PANEL
CHOL/HDL RATIO: 6.3 ratio — AB (ref <=5.0)
CHOLESTEROL: 190 mg/dL (ref 125–200)
HDL: 30 mg/dL — AB (ref >=46)
LDL Cholesterol: 103 mg/dL (ref <130)
Triglycerides: 286 mg/dL — ABNORMAL HIGH (ref <150)
VLDL: 57 mg/dL — ABNORMAL HIGH (ref <30)

## 2016-09-15 LAB — IPS PAP TEST WITH REFLEX TO HPV

## 2016-09-15 LAB — VITAMIN D 25 HYDROXY (VIT D DEFICIENCY, FRACTURES): VIT D 25 HYDROXY: 35 ng/mL (ref 30–100)

## 2016-09-18 ENCOUNTER — Telehealth: Payer: Self-pay

## 2016-09-18 NOTE — Telephone Encounter (Signed)
Left message to call Kaitlyn at [phone]. 

## 2016-09-18 NOTE — Telephone Encounter (Signed)
-----   Message from Regina Eck, CNM sent at 09/16/2016  6:44 AM EDT ----- Notify patient that her Vitamin D is borderline low and needs to be on Vitamin D 3 OTC  1000 Iu daily TSH normal Liver, kidney, glucose profile normal Hgb A 1-C is normal Cholesterol normal, but very elevated triglycerides, she needs PCP management for. If no PCP she can be referred to Colin Benton, MD Pap smear is negative 02

## 2016-09-18 NOTE — Telephone Encounter (Signed)
Spoke with patient. Advised of message and results as seen below from [place]. Patient verbalizes understanding. Patient declines referral to PCP at this time. Provided patient with Dr.Hannah Kim's telephone number at [phone]. Patient is agreeable and will contact to schedule appointment. Declines assistance. 02 recall placed.  Routing to provider for final review. Patient agreeable to disposition. Will close encounter.

## 2016-09-18 NOTE — Progress Notes (Signed)
Encounter reviewed Jill Jertson, MD   

## 2016-09-19 ENCOUNTER — Telehealth: Payer: Self-pay | Admitting: Certified Nurse Midwife

## 2016-09-19 NOTE — Telephone Encounter (Signed)
Medication refill request: JOLESSA Last AEX:  09/14/16 DL Next AEX: 09/18/17 Last MMG (if hormonal medication request): none Refill authorized: 07/15/15 #91 w/3 refills; today please advise.

## 2016-09-19 NOTE — Telephone Encounter (Signed)
Pharmacy is calling to find out if there was suppose to be a prescription for birth control sent over for the patient.

## 2016-09-19 NOTE — Telephone Encounter (Signed)
Patient called requesting a prescription for her birth control be sent to her new pharmacy on file, [facility]. She was seen for her AEX on 09/14/16 but the pharmacy does not have her prescription. She reports she took her last pill last night and needs her refill today.

## 2016-09-20 ENCOUNTER — Other Ambulatory Visit: Payer: Self-pay | Admitting: Certified Nurse Midwife

## 2016-09-20 MED ORDER — LEVONORGEST-ETH ESTRAD 91-DAY 0.15-0.03 MG PO TABS: 1.0000 | ORAL_TABLET | Freq: Every day | ORAL | 3 refills | Status: DC

## 2016-09-20 NOTE — Telephone Encounter (Signed)
Rx sent to pharmacy   

## 2016-09-20 NOTE — Telephone Encounter (Signed)
Order was sent previously as noted in chart. Resent order to pharmacy

## 2016-09-20 NOTE — Telephone Encounter (Signed)
[facility] is calling asking for status if this birth control refill request. Patient is completely out of her prescription and has missed a dose. Patient was seen 09/14/16 and said this was supposed to be sent with her other prescription but only the Sertraline was sent. Patient needs this prescription ASAP.

## 2016-12-15 ENCOUNTER — Telehealth: Payer: Self-pay | Admitting: Certified Nurse Midwife

## 2016-12-15 ENCOUNTER — Encounter: Payer: Self-pay | Admitting: Internal Medicine

## 2016-12-15 ENCOUNTER — Ambulatory Visit (INDEPENDENT_AMBULATORY_CARE_PROVIDER_SITE_OTHER): Payer: BLUE CROSS/BLUE SHIELD | Admitting: Internal Medicine

## 2016-12-15 VITALS — BP 114/66 | HR 64 | Temp 98.1°F | Ht 59.5 in | Wt 169.8 lb

## 2016-12-15 DIAGNOSIS — R635 Abnormal weight gain: Secondary | ICD-10-CM | POA: Diagnosis not present

## 2016-12-15 DIAGNOSIS — R519 Headache, unspecified: Secondary | ICD-10-CM | POA: Insufficient documentation

## 2016-12-15 DIAGNOSIS — R51 Headache: Secondary | ICD-10-CM | POA: Diagnosis not present

## 2016-12-15 MED ORDER — BUTALBITAL-APAP-CAFFEINE 50-325-40 MG PO TABS: 1.0000 | ORAL_TABLET | Freq: Four times a day (QID) | ORAL | 0 refills | Status: DC | PRN

## 2016-12-15 MED ORDER — PHENTERMINE HCL 37.5 MG PO CAPS: 37.5000 mg | ORAL_CAPSULE | ORAL | 0 refills | Status: DC

## 2016-12-15 NOTE — Telephone Encounter (Signed)
Left message on vm (per DPR) and asked patient to call Geneviene Tesch back at [phone].

## 2016-12-15 NOTE — Telephone Encounter (Signed)
Medication refill request: Levonorgestrel-Ethinyl Last AEX:  09/14/16 DL Next AEX: 09/18/17 DL Last MMG (if hormonal medication request): n/a Refill authorized: 09/20/16 #[id number]. Please advise on authorization for refill of Levonorgestrel. In "Note to Pharmacy" from prescription dated 09/20/16, it states that authorization is required for next refill. Thank you.

## 2016-12-15 NOTE — Telephone Encounter (Signed)
She was to let me know the status of MRI for her headache before refills given. Patient will need phone call.

## 2016-12-15 NOTE — Assessment & Plan Note (Signed)
I do not want to start her on prophylactic medications at this time RX for Fioricet to take prn She will let me know if this does not help

## 2016-12-15 NOTE — Progress Notes (Signed)
HPI  Pt presents to the clinic today to establish care and for management of the conditions listed below. She has not had a PCP in many years. She has been following with GYN.  Frequent Headaches: The occur about 1 x week. They are usually located in the back of her head. She describes the pain as sharp. Applying pressure makes it worse. She denies visual changes or dizziness. She denies sensitivity to light or sound, nausea or vomiting. She has had xrays of cervical spine which came back normal. She has tried Advil and Tylenol as needed with good relief.  Abnormal Weight Gain: She really started notice the weight gain over the 2 years. Breakfast: Premier protein shake. Lunch: Usually tune or salad. Dinner: This is her biggest meal. She usually has a meat with 2 veggies. Snack: Carrots, cabbage, celery. She drinks mostly diet drinks, some water. She exercises 1-2 days a week, at home exercise routine for 25 minutes.  Flu: 08/2016 Tetanus: 11/2012 Pap Smear: 09/2016 Mammogram: 07/2016 Vision Screening: as needed Dentist: annually  Past Medical History:  Diagnosis Date  . Abnormal Pap smear 2004   Hx of abnormal pap smear   . Headache(784.0) 04/17/12   "maybe once/wk"  . Migraines   . Sleep disturbance, unspecified    "just not sleeping well sometimes"  . Weight gain     Current Outpatient Prescriptions  Medication Sig Dispense Refill  . Acetaminophen (TYLENOL PO) Take by mouth daily.    Marland Kitchen ibuprofen (ADVIL,MOTRIN) 200 MG tablet Take 200-800 mg by mouth every 6 (six) hours as needed. Headache or pain    . levonorgestrel-ethinyl estradiol (JOLESSA) 0.15-0.03 MG tablet Take 1 tablet by mouth daily. 91 tablet 3   No current facility-administered medications for this visit.     No Known Allergies  Family History  Problem Relation Age of Onset  . Hyperlipidemia Mother   . Arthritis Mother     Social History   Social History  . Marital status: Married    Spouse name: N/A  . Number  of children: N/A  . Years of education: N/A   Occupational History  . Not on file.   Social History Main Topics  . Smoking status: Never Smoker  . Smokeless tobacco: Never Used  . Alcohol use Yes     Comment: rare  . Drug use: No  . Sexual activity: Yes    Partners: Male    Birth control/ protection: Pill   Other Topics Concern  . Not on file   Social History Narrative  . No narrative on file    ROS:  Constitutional: Pt reports headache and weight gain. Denies fever, malaise, fatigue.  Respiratory: Denies difficulty breathing, shortness of breath, cough or sputum production.   Cardiovascular: Denies chest pain, chest tightness, palpitations or swelling in the hands or feet.  Neurological: Denies dizziness, difficulty with memory, difficulty with speech or problems with balance and coordination.  Psych: Denies anxiety, depression, SI/HI.  No other specific complaints in a complete review of systems (except as listed in HPI above).  PE:  BP 114/66   Pulse 64   Temp 98.1 F (36.7 C) (Oral)   Ht 4' 11.5" (1.511 m)   Wt 169 lb 12 oz (77 kg)   LMP 09/14/2016 Comment: irregular  SpO2 98%   BMI 33.71 kg/m  Wt Readings from Last 3 Encounters:  12/15/16 169 lb 12 oz (77 kg)  09/14/16 165 lb (74.8 kg)  10/25/15 166 lb (75.3 kg)  General: Appears her stated age, obese in NAD. Skin: Warm dry and intact. Cardiovascular: Normal rate and rhythm.  Pulmonary/Chest: Normal effort and positive vesicular breath sounds. No respiratory distress. No wheezes, rales or ronchi noted.  Neurological: Alert and oriented.  Psychiatric: Mood and affect normal. Behavior is normal. Judgment and thought content normal.     BMET    Component Value Date/Time   NA 137 09/14/2016 1544   K 4.2 09/14/2016 1544   CL 105 09/14/2016 1544   CO2 21 09/14/2016 1544   GLUCOSE 83 09/14/2016 1544   BUN 10 09/14/2016 1544   CREATININE 0.86 09/14/2016 1544   CALCIUM 9.0 09/14/2016 1544    GFRNONAA >90 04/17/2012 0745   GFRAA >90 04/17/2012 0745    Lipid Panel     Component Value Date/Time   CHOL 190 09/14/2016 1544   TRIG 286 (H) 09/14/2016 1544   HDL 30 (L) 09/14/2016 1544   CHOLHDL 6.3 (H) 09/14/2016 1544   VLDL 57 (H) 09/14/2016 1544   LDLCALC 103 09/14/2016 1544    CBC    Component Value Date/Time   WBC 6.0 09/14/2016 1544   RBC 4.33 09/14/2016 1544   HGB 13.6 09/14/2016 1544   HGB 13.9 12/03/2014 1302   HCT 41.2 09/14/2016 1544   PLT 314 09/14/2016 1544   MCV 95.2 09/14/2016 1544   MCH 31.4 09/14/2016 1544   MCHC 33.0 09/14/2016 1544   RDW 13.6 09/14/2016 1544   LYMPHSABS 1.0 04/17/2012 0745   MONOABS 0.3 04/17/2012 0745   EOSABS 0.0 04/17/2012 0745   BASOSABS 0.0 04/17/2012 0745    Hgb A1C Lab Results  Component Value Date   HGBA1C 5.4 09/14/2016     Assessment and Plan:

## 2016-12-15 NOTE — Assessment & Plan Note (Signed)
I will start her on Phentermine, RX provided Discussed healthy diet and the importance of exercise for weight loss  RTC in 1 month for weight check, med refill

## 2016-12-15 NOTE — Patient Instructions (Signed)
General Headache Without Cause Introduction A headache is pain or discomfort felt around the head or neck area. There are many causes and types of headaches. In some cases, the cause may not be found. Follow these instructions at home: Managing pain  Take over-the-counter and prescription medicines only as told by your doctor.  Lie down in a dark, quiet room when you have a headache.  If directed, apply ice to the head and neck area:  Put ice in a plastic bag.  Place a towel between your skin and the bag.  Leave the ice on for 20 minutes, 2-3 times per day.  Use a heating pad or hot shower to apply heat to the head and neck area as told by your doctor.  Keep lights dim if bright lights bother you or make your headaches worse. Eating and drinking  Eat meals on a regular schedule.  Lessen how much alcohol you drink.  Lessen how much caffeine you drink, or stop drinking caffeine. General instructions  Keep all follow-up visits as told by your doctor. This is important.  Keep a journal to find out if certain things bring on headaches. For example, write down:  What you eat and drink.  How much sleep you get.  Any change to your diet or medicines.  Relax by getting a massage or doing other relaxing activities.  Lessen stress.  Sit up straight. Do not tighten (tense) your muscles.  Do not use tobacco products. This includes cigarettes, chewing tobacco, or e-cigarettes. If you need help quitting, ask your doctor.  Exercise regularly as told by your doctor.  Get enough sleep. This often means 7-9 hours of sleep. Contact a doctor if:  Your symptoms are not helped by medicine.  You have a headache that feels different than the other headaches.  You feel sick to your stomach (nauseous) or you throw up (vomit).  You have a fever. Get help right away if:  Your headache becomes really bad.  You keep throwing up.  You have a stiff neck.  You have trouble  seeing.  You have trouble speaking.  You have pain in the eye or ear.  Your muscles are weak or you lose muscle control.  You lose your balance or have trouble walking.  You feel like you will pass out (faint) or you pass out.  You have confusion. This information is not intended to replace advice given to you by your health care provider. Make sure you discuss any questions you have with your health care provider. Document Released: 08/08/2008 Document Revised: 04/06/2016 Document Reviewed: 02/22/2015  2017 Elsevier  

## 2016-12-15 NOTE — Telephone Encounter (Signed)
Patient is calling to get a refill for [facility]. She is using [employer].

## 2016-12-22 ENCOUNTER — Encounter: Payer: Self-pay | Admitting: Certified Nurse Midwife

## 2016-12-22 ENCOUNTER — Telehealth: Payer: Self-pay | Admitting: Certified Nurse Midwife

## 2016-12-22 ENCOUNTER — Ambulatory Visit (INDEPENDENT_AMBULATORY_CARE_PROVIDER_SITE_OTHER): Payer: BLUE CROSS/BLUE SHIELD | Admitting: Certified Nurse Midwife

## 2016-12-22 VITALS — BP 118/74 | HR 68 | Resp 16 | Ht 64.5 in | Wt 162.0 lb

## 2016-12-22 DIAGNOSIS — R102 Pelvic and perineal pain: Secondary | ICD-10-CM | POA: Diagnosis not present

## 2016-12-22 NOTE — Patient Instructions (Signed)
Avoid over stretching with exercise with pelvic muscles. Jumping jacks, squats. Advil 600- 800 mg every 6-8 hours with food next 2-3 days to see if resolves Call if pain increases.

## 2016-12-22 NOTE — Telephone Encounter (Signed)
Spoke with patient. Patient states that she has been having intermittent vaginal pain. Describes pain as "sharp and throbbing." Pain started 3 days ago. Patient's cycle ended yesterday 12/21/2016. Patient states that she recently switched body wash and felt this may be contributing to pain. Switched back to her original wash and pain has continued. Denies any burning, itching, or discharge. Patient started taking Phentermine on 12/15/2016. Advised Phentermine should not be causing the symptoms she is having. Advised she will need to be seen for further evaluation. OV scheduled for today at 2:15 pm with Melvia Heaps CNM. Patient requests I speak with Melvia Heaps CNM to ensure OV is needed before she is seen today or if Melvia Heaps CNM has any additional recommendations.

## 2016-12-22 NOTE — Progress Notes (Signed)
41 y.o. Married Caucasian female [id number] here with complaint of vaginal pain for 3-4 days. "Throbbing pain" started with use of new soap with scent. It continued and change back to regular soap and has been soaking in tub with no change. Last sexual activity 2 weeks ago. No discharge or cycle change. No burning or itching or increase  Vaginal discharge. Taking Phentermine for weight loss. Working on exercise, which is new for her.   Patient also noted pain with doing jumping jacks and did 75 at a time which she has never done. Has not tried any OTC medication for pain. Denies any lower back pain. Some cramping with period which is occurring at present. Vaginal pain is decreasing in frequency since onset. No hip pain either. No other health issues today.   ROS Pertinent to HPI.  O:Healthy female WDWN Affect: normal, orientation x 3 No apparent distress  Exam:Skin warm and dry CVAT: negative Abdomen: non tender, no masses or rebound  Inguinal Lymph nodes: no enlargement or tenderness Pelvic exam: External genital: normal female,no lesion  Or redness BUS: negative Bladder, urethral meatus non tender Vagina: blood present with period, small amount   Affirm taken, inspection reveals no lacerations or bruising or lesions Pelvic floor posterior bilateral SI ligament area tender with palpation, no bulging noted Patient feels this is the sensation she has with touching with exam, pain scale 2-3,  Cervix: normal, non tender, no CMT Uterus: normal, non tender Adnexa:normal, non tender, no masses or fullness noted   A:Normal pelvic exam SI ligament area point of pain probably due to new exercise pattern R/O vaginal infection   P:Discussed findings of suspect inflamed SI ligament/joint area and etiology. Discussed OTC Advil 600-800mg  every 6-8 hours for the next 2-3 days with food to see if this resolves the pain. Stop exercise to this area and avoid long periods of sitting. Questions answered. Call  if pain not resolving or increasing. Patient appreciative and will advise if  No change.Marland Kitchen

## 2016-12-22 NOTE — Telephone Encounter (Signed)
Patient called requesting to speak with the nurse about "pain." She declined to give any other details as she was at work.  She requests a call back at her work number: [phone].  Last seen: 09/14/16

## 2016-12-22 NOTE — Telephone Encounter (Signed)
Spoke with patient after review with Phyllis Morales CNM. Patient states she has not had vaginal or oral intercourse recently. Denies any history of hemorrhoids. Advised best way to ensure proper evaluation and treatment is OV. Patient is agreeable and will keep appointment as scheduled for today at 2:15 pm.  Routing to provider for final review. Patient agreeable to disposition. Will close encounter.

## 2016-12-23 LAB — WET PREP BY MOLECULAR PROBE
Candida species: NEGATIVE
Gardnerella vaginalis: NEGATIVE
Trichomonas vaginosis: NEGATIVE

## 2016-12-23 NOTE — Progress Notes (Signed)
Encounter reviewed [name], MD   

## 2017-01-12 ENCOUNTER — Ambulatory Visit (INDEPENDENT_AMBULATORY_CARE_PROVIDER_SITE_OTHER): Payer: BLUE CROSS/BLUE SHIELD | Admitting: Internal Medicine

## 2017-01-12 ENCOUNTER — Encounter: Payer: Self-pay | Admitting: Internal Medicine

## 2017-01-12 VITALS — BP 116/78 | HR 71 | Temp 97.5°F | Ht 59.5 in | Wt 158.0 lb

## 2017-01-12 DIAGNOSIS — R635 Abnormal weight gain: Secondary | ICD-10-CM

## 2017-01-12 MED ORDER — PHENTERMINE HCL 37.5 MG PO CAPS: 37.5000 mg | ORAL_CAPSULE | ORAL | 0 refills | Status: DC

## 2017-01-12 NOTE — Progress Notes (Signed)
Subjective:    Patient ID: Phyllis Morales, female    DOB: [birth year], 41 y.o.   MRN: [id number]  HPI  Pt presents to the clinic today for 1 month follow up for weight check and med refill. She was started on Phentermine at her last visit. Her starting weight was 169.75 with a BMI of 33.71. She has been taking the medication as prescribed. Her weight today is 158 lb with a BMI of 31.38. She reports she is getting up at 5:30 to exercise for 30 minutes 2-3 days a week.  Review of Systems      Past Medical History:  Diagnosis Date  . Abnormal Pap smear 2004   Hx of abnormal pap smear   . Headache(784.0) 04/17/12   "maybe once/wk"  . Sleep disturbance, unspecified    "just not sleeping well sometimes"    Current Outpatient Prescriptions  Medication Sig Dispense Refill  . Acetaminophen (TYLENOL PO) Take by mouth daily.    . butalbital-acetaminophen-caffeine (FIORICET, ESGIC) 50-325-40 MG tablet Take 1-2 tablets by mouth every 6 (six) hours as needed for headache. 30 tablet 0  . ibuprofen (ADVIL,MOTRIN) 200 MG tablet Take 200-800 mg by mouth every 6 (six) hours as needed. Headache or pain    . levonorgestrel-ethinyl estradiol (JOLESSA) 0.15-0.03 MG tablet Take 1 tablet by mouth daily. 91 tablet 3  . phentermine 37.5 MG capsule Take 1 capsule (37.5 mg total) by mouth every morning. 30 capsule 0   No current facility-administered medications for this visit.     No Known Allergies  Family History  Problem Relation Age of Onset  . Hyperlipidemia Mother   . Arthritis Mother   . Stroke Father     Social History   Social History  . Marital status: Married    Spouse name: N/A  . Number of children: N/A  . Years of education: N/A   Occupational History  . Not on file.   Social History Main Topics  . Smoking status: Never Smoker  . Smokeless tobacco: Never Used  . Alcohol use Yes     Comment: rare  . Drug use: No  . Sexual activity: Yes    Partners: Male    Birth  control/ protection: Pill   Other Topics Concern  . Not on file   Social History Narrative  . No narrative on file     Constitutional: Denies fever, malaise, fatigue, headache or abrupt weight changes.  Respiratory: Denies difficulty breathing, shortness of breath, cough or sputum production.   Cardiovascular: Denies chest pain, chest tightness, palpitations or swelling in the hands or feet.   Neurological: Denies dizziness, difficulty with memory, difficulty with speech or problems with balance and coordination.    No other specific complaints in a complete review of systems (except as listed in HPI above).  Objective:   Physical Exam   BP 116/78   Pulse 71   Temp 97.5 F (36.4 C) (Oral)   Ht 4' 11.5" (1.511 m)   Wt 158 lb (71.7 kg)   LMP 12/16/2016 (Exact Date)   SpO2 98%   BMI 31.38 kg/m  Wt Readings from Last 3 Encounters:  01/12/17 158 lb (71.7 kg)  12/22/16 162 lb (73.5 kg)  12/15/16 169 lb 12 oz (77 kg)    General: Appears her stated age, well developed, obese in NAD. Cardiovascular: Normal rate and rhythm.  Pulmonary/Chest: Normal effort and positive vesicular breath sounds. No respiratory distress. No wheezes, rales or ronchi noted.  Neurological: Alert and oriented.      BMET    Component Value Date/Time   NA 137 09/14/2016 1544   K 4.2 09/14/2016 1544   CL 105 09/14/2016 1544   CO2 21 09/14/2016 1544   GLUCOSE 83 09/14/2016 1544   BUN 10 09/14/2016 1544   CREATININE 0.86 09/14/2016 1544   CALCIUM 9.0 09/14/2016 1544   GFRNONAA >90 04/17/2012 0745   GFRAA >90 04/17/2012 0745    Lipid Panel     Component Value Date/Time   CHOL 190 09/14/2016 1544   TRIG 286 (H) 09/14/2016 1544   HDL 30 (L) 09/14/2016 1544   CHOLHDL 6.3 (H) 09/14/2016 1544   VLDL 57 (H) 09/14/2016 1544   LDLCALC 103 09/14/2016 1544    CBC    Component Value Date/Time   WBC 6.0 09/14/2016 1544   RBC 4.33 09/14/2016 1544   HGB 13.6 09/14/2016 1544   HGB 13.9  12/03/2014 1302   HCT 41.2 09/14/2016 1544   PLT 314 09/14/2016 1544   MCV 95.2 09/14/2016 1544   MCH 31.4 09/14/2016 1544   MCHC 33.0 09/14/2016 1544   RDW 13.6 09/14/2016 1544   LYMPHSABS 1.0 04/17/2012 0745   MONOABS 0.3 04/17/2012 0745   EOSABS 0.0 04/17/2012 0745   BASOSABS 0.0 04/17/2012 0745    Hgb A1C Lab Results  Component Value Date   HGBA1C 5.4 09/14/2016           Assessment & Plan:   Abnormal Weight Gain:  Congratulated her on her 11 lb weight loss Encouraged her to continue to work on diet and exercise Phentermine refilled today  RTC in 2 months for weight check/med refill Webb Silversmith, NP

## 2017-01-12 NOTE — Patient Instructions (Signed)
Carbohydrate Counting for Diabetes Mellitus, Adult Carbohydrate counting is a method for keeping track of how many carbohydrates you eat. Eating carbohydrates naturally increases the amount of sugar (glucose) in the blood. Counting how many carbohydrates you eat helps keep your blood glucose within normal limits, which helps you manage your diabetes (diabetes mellitus). It is important to know how many carbohydrates you can safely have in each meal. This is different for every person. A diet and nutrition specialist (registered dietitian) can help you make a meal plan and calculate how many carbohydrates you should have at each meal and snack. Carbohydrates are found in the following foods:  Grains, such as breads and cereals.  Dried beans and soy products.  Starchy vegetables, such as potatoes, peas, and corn.  Fruit and fruit juices.  Milk and yogurt.  Sweets and snack foods, such as cake, cookies, candy, chips, and soft drinks. How do I count carbohydrates? There are two ways to count carbohydrates in food. You can use either of the methods or a combination of both. Reading "Nutrition Facts" on packaged food  The "Nutrition Facts" list is included on the labels of almost all packaged foods and beverages in the [place] It includes:  The serving size.  Information about nutrients in each serving, including the grams (g) of carbohydrate per serving. To use the "Nutrition Facts":  Decide how many servings you will have.  Multiply the number of servings by the number of carbohydrates per serving.  The resulting number is the total amount of carbohydrates that you will be having. Learning standard serving sizes of other foods  When you eat foods containing carbohydrates that are not packaged or do not include "Nutrition Facts" on the label, you need to measure the servings in order to count the amount of carbohydrates:  Measure the foods that you will eat with a food scale or measuring  cup, if needed.  Decide how many standard-size servings you will eat.  Multiply the number of servings by 15. Most carbohydrate-rich foods have about 15 g of carbohydrates per serving.  For example, if you eat 8 oz (170 g) of strawberries, you will have eaten 2 servings and 30 g of carbohydrates (2 servings x 15 g = 30 g).  For foods that have more than one food mixed, such as soups and casseroles, you must count the carbohydrates in each food that is included. The following list contains standard serving sizes of common carbohydrate-rich foods. Each of these servings has about 15 g of carbohydrates:   hamburger bun or  English muffin.   oz (15 mL) syrup.   oz (14 g) jelly.  1 slice of bread.  1 six-inch tortilla.  3 oz (85 g) cooked rice or pasta.  4 oz (113 g) cooked dried beans.  4 oz (113 g) starchy vegetable, such as peas, corn, or potatoes.  4 oz (113 g) hot cereal.  4 oz (113 g) mashed potatoes or  of a large baked potato.  4 oz (113 g) canned or frozen fruit.  4 oz (120 mL) fruit juice.  4-6 crackers.  6 chicken nuggets.  6 oz (170 g) unsweetened dry cereal.  6 oz (170 g) plain fat-free yogurt or yogurt sweetened with artificial sweeteners.  8 oz (240 mL) milk.  8 oz (170 g) fresh fruit or one small piece of fruit.  24 oz (680 g) popped popcorn. Example of carbohydrate counting Sample meal  3 oz (85 g) chicken breast.  6 oz (  170 g) brown rice.  4 oz (113 g) corn.  8 oz (240 mL) milk.  8 oz (170 g) strawberries with sugar-free whipped topping. Carbohydrate calculation 1. Identify the foods that contain carbohydrates:  Rice.  Corn.  Milk.  Strawberries. 2. Calculate how many servings you have of each food:  2 servings rice.  1 serving corn.  1 serving milk.  1 serving strawberries. 3. Multiply each number of servings by 15 g:  2 servings rice x 15 g = 30 g.  1 serving corn x 15 g = 15 g.  1 serving milk x 15 g = 15  g.  1 serving strawberries x 15 g = 15 g. 4. Add together all of the amounts to find the total grams of carbohydrates eaten:  30 g + 15 g + 15 g + 15 g = 75 g of carbohydrates total. This information is not intended to replace advice given to you by your health care provider. Make sure you discuss any questions you have with your health care provider. Document Released: 10/30/2005 Document Revised: 05/19/2016 Document Reviewed: 04/12/2016 Elsevier Interactive Patient Education  2017 Elsevier Inc.  

## 2017-02-23 NOTE — Telephone Encounter (Signed)
yes

## 2017-02-23 NOTE — Telephone Encounter (Signed)
Ok to close encounter. 

## 2017-03-16 ENCOUNTER — Ambulatory Visit: Payer: BLUE CROSS/BLUE SHIELD | Admitting: Internal Medicine

## 2017-03-21 ENCOUNTER — Other Ambulatory Visit: Payer: Self-pay | Admitting: Internal Medicine

## 2017-03-21 NOTE — Telephone Encounter (Signed)
Please phone in Fioricet 

## 2017-03-21 NOTE — Telephone Encounter (Signed)
Last filled 12/15/16-- please advise

## 2017-03-22 NOTE — Telephone Encounter (Signed)
Rx called in to pharmacy. 

## 2017-06-04 ENCOUNTER — Telehealth: Payer: Self-pay | Admitting: Certified Nurse Midwife

## 2017-06-04 NOTE — Telephone Encounter (Signed)
Patient is requesting to speak with a nurse about her cycle.  No other info given

## 2017-06-04 NOTE — Telephone Encounter (Signed)
Spoke with patient. Taking OCP Ashlyna reports going from spotting to full cycle in the last 2 weeks, not due for menses for 11 more days. Reports no missed pills and taking same time every day. Has been on this OCP for the last 2 years. Denies pain, discharge, nausea/vomiting, fever, STD concerns or partner changes. Last OV 12/22/16. Recommended OV for further evaluation. Patient scheduled for OV on 06/05/17 at 11am with Melvia Heaps, CNM. Advised patient will review with covering provider and return call with any additional recommendations. Patient is agreeable.    Routing to provider for final review. Patient is agreeable to disposition. Will close encounter.   Cc: Melvia Heaps, CNM

## 2017-06-05 ENCOUNTER — Encounter: Payer: Self-pay | Admitting: Certified Nurse Midwife

## 2017-06-05 ENCOUNTER — Ambulatory Visit (INDEPENDENT_AMBULATORY_CARE_PROVIDER_SITE_OTHER): Payer: BLUE CROSS/BLUE SHIELD | Admitting: Certified Nurse Midwife

## 2017-06-05 VITALS — BP 112/60 | HR 60 | Resp 16 | Wt 161.0 lb

## 2017-06-05 DIAGNOSIS — M545 Low back pain: Secondary | ICD-10-CM

## 2017-06-05 DIAGNOSIS — Z3041 Encounter for surveillance of contraceptive pills: Secondary | ICD-10-CM

## 2017-06-05 DIAGNOSIS — N926 Irregular menstruation, unspecified: Secondary | ICD-10-CM | POA: Diagnosis not present

## 2017-06-05 LAB — POCT URINALYSIS DIPSTICK
BILIRUBIN UA: NEGATIVE
Glucose, UA: NEGATIVE
KETONES UA: NEGATIVE
LEUKOCYTES UA: NEGATIVE
Nitrite, UA: NEGATIVE
PH UA: 6 (ref 5.0–8.0)
PROTEIN UA: NEGATIVE
Urobilinogen, UA: NEGATIVE E.U./dL — AB

## 2017-06-05 LAB — POCT URINE PREGNANCY: PREG TEST UR: NEGATIVE

## 2017-06-05 NOTE — Progress Notes (Signed)
Subjective:     Patient ID: Phyllis Morales, female   DOB: 1976-08-24, 41 y.o.   MRN: [id number]  HPI 41 yo married white female g1p1001 here with complaint of spotting for the past 2 weeks with period like bleeding  For the past week with cramping.  period and then spotting.   Color was some brown and her normal red with period color, no clotting. Patient takes extended cycle OCP for dysmenorrhea  and has not issues with BTB until this pack, which was a new generic. Onset occurred on 8 week of pill pack with 7 days of period like bleeding and last few days spotting. Has had slight weight gain, but no other issues. Noted some back and shoulder pain, but unrelated. Living with relatives and increase stress, until house completed. Denies abdominal pain, nausea, fever, chills. [place] and wanted to be sure all normal. No other health issues today.  Review of Systems  Constitutional: Negative.   Gastrointestinal: Negative.   Genitourinary: Positive for vaginal bleeding. Negative for pelvic pain and vaginal pain.  Musculoskeletal: Negative for back pain.  Skin: Negative.   Psychiatric/Behavioral: The patient is not nervous/anxious.        Objective:   Physical Exam  Constitutional: She is oriented to person, place, and time. She appears well-developed and well-nourished.  Abdominal: She exhibits no mass. There is no tenderness. There is no rebound and no guarding.  Genitourinary: Uterus normal. There is no rash, tenderness or lesion on the right labia. There is no rash, tenderness or lesion on the left labia. Cervix exhibits no motion tenderness, no discharge and no friability. Right adnexum displays no mass, no tenderness and no fullness. Left adnexum displays no mass, no tenderness and no fullness. There is tenderness in the vagina. No bleeding in the vagina. Vaginal discharge found.  Genitourinary Comments: Scant beige colored discharge only, no blood noted   Musculoskeletal: Normal range of motion.  Lymphadenopathy:       Right: No inguinal adenopathy present.       Left: No inguinal adenopathy present.  Neurological: She is alert and oriented to person, place, and time.  Skin: Skin is warm and dry.  Psychiatric: She has a normal mood and affect. Her behavior is normal. Judgment and thought content normal.       Assessment:     Normal pelvic exam BTB on Extended cycle use OCP Muscle skeletal pain ? Etiology with stress with living in small surroundings    Plan:     Discussed finding of normal pelvic exam and no abnormal finding. Discussed not uncommon on extended cycle use OCP to have BTB. Discussed possible etiology with weight change, the generic change of OCP and etiology of OCP use. Questions addressed and looked at pill pack with patient. Now on beginning of last week of active pills. Advise to finish pack and start new pack and request previous generic. Patient will use BUM during this interim and will advise if bleeding occurs again. Discussed IUD use if BTB still occurring with normal exam. Suggest PCP visit for back or trying warm tub bath for relaxation of muscles. Patient may try massage prior to travel to see if resolves. Warning signs given and need to seek care if occurs with back. Questions addressed.   Rv prn

## 2017-06-05 NOTE — Patient Instructions (Signed)

## 2017-09-08 ENCOUNTER — Other Ambulatory Visit: Payer: Self-pay | Admitting: Certified Nurse Midwife

## 2017-09-10 NOTE — Telephone Encounter (Signed)
Medication refill request: OCP  Last AEX:  09-15-15  Next AEX: 09-18-17 Last MMG (if hormonal medication request): none on file Refill authorized: please advise

## 2017-09-10 NOTE — Telephone Encounter (Signed)
Will refill but needs mammogram before future refills

## 2017-09-18 ENCOUNTER — Other Ambulatory Visit (HOSPITAL_COMMUNITY)
Admission: RE | Admit: 2017-09-18 | Discharge: 2017-09-18 | Disposition: A | Discharge status: Home / Self Care | Payer: BLUE CROSS/BLUE SHIELD | Source: Ambulatory Visit | Attending: Certified Nurse Midwife | Admitting: Certified Nurse Midwife

## 2017-09-18 ENCOUNTER — Ambulatory Visit: Payer: BLUE CROSS/BLUE SHIELD | Admitting: Certified Nurse Midwife

## 2017-09-18 ENCOUNTER — Encounter: Payer: Self-pay | Admitting: Certified Nurse Midwife

## 2017-09-18 VITALS — BP 102/62 | HR 68 | Resp 16 | Ht 59.25 in | Wt 166.0 lb

## 2017-09-18 DIAGNOSIS — R51 Headache: Secondary | ICD-10-CM

## 2017-09-18 DIAGNOSIS — G8929 Other chronic pain: Secondary | ICD-10-CM

## 2017-09-18 DIAGNOSIS — Z01419 Encounter for gynecological examination (general) (routine) without abnormal findings: Secondary | ICD-10-CM

## 2017-09-18 DIAGNOSIS — Z124 Encounter for screening for malignant neoplasm of cervix: Secondary | ICD-10-CM

## 2017-09-18 DIAGNOSIS — Z3041 Encounter for surveillance of contraceptive pills: Secondary | ICD-10-CM

## 2017-09-18 MED ORDER — LEVONORGEST-ETH ESTRAD 91-DAY 0.15-0.03 &0.01 MG PO TABS: 1.0000 | ORAL_TABLET | Freq: Every day | ORAL | 1 refills | Status: DC

## 2017-09-18 NOTE — Progress Notes (Signed)
41 y.o. G77P1001 Married  Caucasian Fe here for annual exam. Periods normal no issues. OCP working well. Menorrhagia has resolved with OCP use. Had MRI for evaluation of headache, with Dr. Laurence Compton (Orthopedic) due head neck/pain with negative finding. Saw Cecille Po PCP for labs and for headache and was given just pain medication, she felt did not work. She continues to have headaches not related to period or OCP.  Patient would like to get this under control. Tired of feeling bad and the anxiety around when the next one will occur. No other health issues today.   Patient's last menstrual period was 09/13/2017.          Sexually active: Yes.    The current method of family planning is OCP (estrogen/progesterone).    Exercising: No.  exercise Smoker:  no  Health Maintenance: Pap:  07-15-15 neg HPV HR neg, 09-14-16 neg History of Abnormal Pap: yes MMG:  2016  Self Breast exams: no Colonoscopy:  none BMD:   none TDaP:  2014 Shingles: no Pneumonia: no Hep C and HIV: not done Labs: if needed   reports that  has never smoked. she has never used smokeless tobacco. She reports that she drinks alcohol. She reports that she does not use drugs.  Past Medical History:  Diagnosis Date  . Abnormal Pap smear 2004   Hx of abnormal pap smear   . Headache(784.0) 04/17/12   "maybe once/wk"  . Sleep disturbance, unspecified    "just not sleeping well sometimes"    Past Surgical History:  Procedure Laterality Date  . 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 DILATION AND CURETTAGE OF UTERUS  ~ 2002    Current Outpatient Medications  Medication Sig Dispense Refill  . ibuprofen (ADVIL,MOTRIN) 200 MG tablet Take 200-800 mg by mouth every 6 (six) hours as needed. Headache or pain    . Levonorgestrel-Ethinyl Estradiol (AMETHIA,CAMRESE) 0.15-0.03 &0.01 MG tablet TAKE 1 TABLET BY MOUTH EVERY DAY 91 tablet 0   No current facility-administered medications for  this visit.     Family History  Problem Relation Age of Onset  . Hyperlipidemia Mother   . Arthritis Mother   . Stroke Father     ROS:  Pertinent items are noted in HPI.  Otherwise, a comprehensive ROS was negative.  Exam:   BP 102/62   Pulse 68   Resp 16   Ht 4' 11.25" (1.505 m)   Wt 166 lb (75.3 kg)   LMP 09/13/2017   BMI 33.25 kg/m  Height: 4' 11.25" (150.5 cm) Ht Readings from Last 3 Encounters:  09/18/17 4' 11.25" (1.505 m)  01/12/17 4' 11.5" (1.511 m)  12/22/16 5' 4.5" (1.638 m)    General appearance: alert, cooperative and appears stated age Head: Normocephalic, without obvious abnormality, atraumatic Neck: no adenopathy, supple, symmetrical, trachea midline and thyroid normal to inspection and palpation Lungs: clear to auscultation bilaterally Breasts: normal appearance, no masses or tenderness, No nipple retraction or dimpling, No axillary or supraclavicular adenopathy Heart: regular rate and rhythm Abdomen: soft, non-tender; no masses,  no organomegaly Extremities: extremities normal, atraumatic, no cyanosis or edema Skin: Skin color, texture, turgor normal. No rashes or lesions Lymph nodes: Cervical, supraclavicular, and axillary nodes normal. No abnormal inguinal nodes palpated Neurologic: Grossly normal   Pelvic: External genitalia:  no lesions              Urethra:  normal appearing  urethra with no masses, tenderness or lesions              Bartholin's and Skene's: normal                 Vagina: normal appearing vagina with normal color and discharge, no lesions              Cervix: no bleeding following Pap, no cervical motion tenderness and no lesions              Pap taken: Yes.   Bimanual Exam:  Uterus:  normal size, contour, position, consistency, mobility, non-tender              Adnexa: normal adnexa and no mass, fullness, tenderness               Rectovaginal: Confirms               Anus:  normal sphincter tone, no lesions  Chaperone present:  yes  A:  Well Woman with normal exam  Contraception working well for cycle control now, previous menorrhagia  Persistent headache unrelated to OCP or menses  Anxiety related to headache occurrence  Mammogram due    P:   Reviewed health and wellness pertinent to exam  Risks/benefits/side effects and warning signs reviewed with OCP use. Desires continuance.  Rx Amethia see order with instructions  Discussed referral to [facility] to evaluate and treat headache. Patient agreeable and aware she will be called with information.  Discussed needs mammogram prior to another refill of OCP. (given one refill). Patient agreeable and will schedule  Pap smear: yes   counseled on breast self exam, mammography screening, use and side effects of OCP's, adequate intake of calcium and vitamin D, diet and exercise  return annually or prn  An After Visit Summary was printed and given to the patient.

## 2017-09-18 NOTE — Patient Instructions (Addendum)
EXERCISE AND DIET:  We recommended that you start or continue a regular exercise program for good health. Regular exercise means any activity that makes your heart beat faster and makes you sweat.  We recommend exercising at least 30 minutes per day at least 3 days a week, preferably 4 or 5.  We also recommend a diet low in fat and sugar.  Inactivity, poor dietary choices and obesity can cause diabetes, heart attack, stroke, and kidney damage, among others.    ALCOHOL AND SMOKING:  Women should limit their alcohol intake to no more than 7 drinks/beers/glasses of wine (combined, not each!) per week. Moderation of alcohol intake to this level decreases your risk of breast cancer and liver damage. And of course, no recreational drugs are part of a healthy lifestyle.  And absolutely no smoking or even second hand smoke. Most people know smoking can cause heart and lung diseases, but did you know it also contributes to weakening of your bones? Aging of your skin?  Yellowing of your teeth and nails?  CALCIUM AND VITAMIN D:  Adequate intake of calcium and Vitamin D are recommended.  The recommendations for exact amounts of these supplements seem to change often, but generally speaking 600 mg of calcium (either carbonate or citrate) and 800 units of Vitamin D per day seems prudent. Certain women may benefit from higher intake of Vitamin D.  If you are among these women, your doctor will have told you during your visit.    PAP SMEARS:  Pap smears, to check for cervical cancer or precancers,  have traditionally been done yearly, although recent scientific advances have shown that most women can have pap smears less often.  However, every woman still should have a physical exam from her gynecologist every year. It will include a breast check, inspection of the vulva and vagina to check for abnormal growths or skin changes, a visual exam of the cervix, and then an exam to evaluate the size and shape of the uterus and  ovaries.  And after 43 years of age, a rectal exam is indicated to check for rectal cancers. We will also provide age appropriate advice regarding health maintenance, like when you should have certain vaccines, screening for sexually transmitted diseases, bone density testing, colonoscopy, mammograms, etc.   MAMMOGRAMS:  All women over 40 years old should have a yearly mammogram. Many facilities now offer a "3D" mammogram, which may cost around $50 extra out of pocket. If possible,  we recommend you accept the option to have the 3D mammogram performed.  It both reduces the number of women who will be called back for extra views which then turn out to be normal, and it is better than the routine mammogram at detecting truly abnormal areas.    COLONOSCOPY:  Colonoscopy to screen for colon cancer is recommended for all women at age 50.  We know, you hate the idea of the prep.  We agree, BUT, having colon cancer and not knowing it is worse!!  Colon cancer so often starts as a polyp that can be seen and removed at colonscopy, which can quite literally save your life!  And if your first colonoscopy is normal and you have no family history of colon cancer, most women don't have to have it again for 10 years.  Once every ten years, you can do something that may end up saving your life, right?  We will be happy to help you get it scheduled when you are ready.    Be sure to check your insurance coverage so you understand how much it will cost.  It may be covered as a preventative service at no cost, but you should check your particular policy.      Oral Contraception Use Oral contraceptive pills (OCPs) are medicines taken to prevent pregnancy. OCPs work by preventing the ovaries from releasing eggs. The hormones in OCPs also cause the cervical mucus to thicken, preventing the sperm from entering the uterus. The hormones also cause the uterine lining to become thin, not allowing a fertilized egg to attach to the inside  of the uterus. OCPs are highly effective when taken exactly as prescribed. However, OCPs do not prevent sexually transmitted diseases (STDs). Safe sex practices, such as using condoms along with an OCP, can help prevent STDs. Before taking OCPs, you may have a physical exam and Pap test. Your health care provider may also order blood tests if necessary. Your health care provider will make sure you are a good candidate for oral contraception. Discuss with your health care provider the possible side effects of the OCP you may be prescribed. When starting an OCP, it can take 2 to 3 months for the body to adjust to the changes in hormone levels in your body. How to take oral contraceptive pills Your health care provider may advise you on how to start taking the first cycle of OCPs. Otherwise, you can:  Start on day 1 of your menstrual period. You will not need any backup contraceptive protection with this start time.  Start on the first Sunday after your menstrual period or the day you get your prescription. In these cases, you will need to use backup contraceptive protection for the first week.  Start the pill at any time of your cycle. If you take the pill within 5 days of the start of your period, you are protected against pregnancy right away. In this case, you will not need a backup form of birth control. If you start at any other time of your menstrual cycle, you will need to use another form of birth control for 7 days. If your OCP is the type called a minipill, it will protect you from pregnancy after taking it for 2 days (48 hours).  After you have started taking OCPs:  If you forget to take 1 pill, take it as soon as you remember. Take the next pill at the regular time.  If you miss 2 or more pills, call your health care provider because different pills have different instructions for missed doses. Use backup birth control until your next menstrual period starts.  If you use a 28-day pack that  contains inactive pills and you miss 1 of the last 7 pills (pills with no hormones), it will not matter. Throw away the rest of the non-hormone pills and start a new pill pack.  No matter which day you start the OCP, you will always start a new pack on that same day of the week. Have an extra pack of OCPs and a backup contraceptive method available in case you miss some pills or lose your OCP pack. Follow these instructions at home:  Do not smoke.  Always use a condom to protect against STDs. OCPs do not protect against STDs.  Use a calendar to mark your menstrual period days.  Read the information and directions that came with your OCP. Talk to your health care provider if you have questions. Contact a health care provider if:  You   develop nausea and vomiting.  You have abnormal vaginal discharge or bleeding.  You develop a rash.  You miss your menstrual period.  You are losing your hair.  You need treatment for mood swings or depression.  You get dizzy when taking the OCP.  You develop acne from taking the OCP.  You become pregnant. Get help right away if:  You develop chest pain.  You develop shortness of breath.  You have an uncontrolled or severe headache.  You develop numbness or slurred speech.  You develop visual problems.  You develop pain, redness, and swelling in the legs. This information is not intended to replace advice given to you by your health care provider. Make sure you discuss any questions you have with your health care provider. Document Released: 10/19/2011 Document Revised: 04/06/2016 Document Reviewed: 04/20/2013 Elsevier Interactive Patient Education  2017 [employer]  

## 2017-09-20 LAB — CYTOLOGY - PAP
Diagnosis: NEGATIVE
HPV: NOT DETECTED

## 2017-10-10 DIAGNOSIS — Z1231 Encounter for screening mammogram for malignant neoplasm of breast: Secondary | ICD-10-CM | POA: Diagnosis not present

## 2017-10-18 DIAGNOSIS — G43719 Chronic migraine without aura, intractable, without status migrainosus: Secondary | ICD-10-CM | POA: Diagnosis not present

## 2017-10-18 DIAGNOSIS — Z049 Encounter for examination and observation for unspecified reason: Secondary | ICD-10-CM | POA: Diagnosis not present

## 2017-11-02 ENCOUNTER — Encounter: Payer: Self-pay | Admitting: Certified Nurse Midwife

## 2017-11-07 DIAGNOSIS — G518 Other disorders of facial nerve: Secondary | ICD-10-CM | POA: Diagnosis not present

## 2017-11-07 DIAGNOSIS — M542 Cervicalgia: Secondary | ICD-10-CM | POA: Diagnosis not present

## 2017-11-07 DIAGNOSIS — G43719 Chronic migraine without aura, intractable, without status migrainosus: Secondary | ICD-10-CM | POA: Diagnosis not present

## 2017-11-07 DIAGNOSIS — M791 Myalgia, unspecified site: Secondary | ICD-10-CM | POA: Diagnosis not present

## 2017-11-22 DIAGNOSIS — G43719 Chronic migraine without aura, intractable, without status migrainosus: Secondary | ICD-10-CM | POA: Diagnosis not present

## 2017-11-22 DIAGNOSIS — M791 Myalgia, unspecified site: Secondary | ICD-10-CM | POA: Diagnosis not present

## 2017-11-22 DIAGNOSIS — G518 Other disorders of facial nerve: Secondary | ICD-10-CM | POA: Diagnosis not present

## 2017-11-22 DIAGNOSIS — M542 Cervicalgia: Secondary | ICD-10-CM | POA: Diagnosis not present

## 2018-01-23 DIAGNOSIS — G43719 Chronic migraine without aura, intractable, without status migrainosus: Secondary | ICD-10-CM | POA: Diagnosis not present

## 2018-01-29 DIAGNOSIS — G518 Other disorders of facial nerve: Secondary | ICD-10-CM | POA: Diagnosis not present

## 2018-01-29 DIAGNOSIS — G43719 Chronic migraine without aura, intractable, without status migrainosus: Secondary | ICD-10-CM | POA: Diagnosis not present

## 2018-01-29 DIAGNOSIS — M542 Cervicalgia: Secondary | ICD-10-CM | POA: Diagnosis not present

## 2018-01-29 DIAGNOSIS — M791 Myalgia, unspecified site: Secondary | ICD-10-CM | POA: Diagnosis not present

## 2018-02-26 DIAGNOSIS — M542 Cervicalgia: Secondary | ICD-10-CM | POA: Diagnosis not present

## 2018-02-26 DIAGNOSIS — G43719 Chronic migraine without aura, intractable, without status migrainosus: Secondary | ICD-10-CM | POA: Diagnosis not present

## 2018-02-26 DIAGNOSIS — G518 Other disorders of facial nerve: Secondary | ICD-10-CM | POA: Diagnosis not present

## 2018-02-26 DIAGNOSIS — M791 Myalgia, unspecified site: Secondary | ICD-10-CM | POA: Diagnosis not present

## 2018-03-11 DIAGNOSIS — R635 Abnormal weight gain: Secondary | ICD-10-CM | POA: Diagnosis not present

## 2018-03-11 DIAGNOSIS — N951 Menopausal and female climacteric states: Secondary | ICD-10-CM | POA: Diagnosis not present

## 2018-03-12 DIAGNOSIS — G518 Other disorders of facial nerve: Secondary | ICD-10-CM | POA: Diagnosis not present

## 2018-03-12 DIAGNOSIS — M791 Myalgia, unspecified site: Secondary | ICD-10-CM | POA: Diagnosis not present

## 2018-03-12 DIAGNOSIS — G43719 Chronic migraine without aura, intractable, without status migrainosus: Secondary | ICD-10-CM | POA: Diagnosis not present

## 2018-03-12 DIAGNOSIS — M542 Cervicalgia: Secondary | ICD-10-CM | POA: Diagnosis not present

## 2018-03-15 DIAGNOSIS — Z1331 Encounter for screening for depression: Secondary | ICD-10-CM | POA: Diagnosis not present

## 2018-03-15 DIAGNOSIS — E8881 Metabolic syndrome: Secondary | ICD-10-CM | POA: Diagnosis not present

## 2018-03-15 DIAGNOSIS — R7303 Prediabetes: Secondary | ICD-10-CM | POA: Diagnosis not present

## 2018-03-15 DIAGNOSIS — E7800 Pure hypercholesterolemia, unspecified: Secondary | ICD-10-CM | POA: Diagnosis not present

## 2018-03-15 DIAGNOSIS — R740 Nonspecific elevation of levels of transaminase and lactic acid dehydrogenase [LDH]: Secondary | ICD-10-CM | POA: Diagnosis not present

## 2018-03-25 DIAGNOSIS — E7800 Pure hypercholesterolemia, unspecified: Secondary | ICD-10-CM | POA: Diagnosis not present

## 2018-03-29 DIAGNOSIS — F411 Generalized anxiety disorder: Secondary | ICD-10-CM | POA: Diagnosis not present

## 2018-03-29 DIAGNOSIS — G4709 Other insomnia: Secondary | ICD-10-CM | POA: Diagnosis not present

## 2018-04-01 DIAGNOSIS — M542 Cervicalgia: Secondary | ICD-10-CM | POA: Diagnosis not present

## 2018-04-01 DIAGNOSIS — G500 Trigeminal neuralgia: Secondary | ICD-10-CM | POA: Diagnosis not present

## 2018-04-01 DIAGNOSIS — G43719 Chronic migraine without aura, intractable, without status migrainosus: Secondary | ICD-10-CM | POA: Diagnosis not present

## 2018-04-01 DIAGNOSIS — G518 Other disorders of facial nerve: Secondary | ICD-10-CM | POA: Diagnosis not present

## 2018-04-01 DIAGNOSIS — M791 Myalgia, unspecified site: Secondary | ICD-10-CM | POA: Diagnosis not present

## 2018-04-01 DIAGNOSIS — G509 Disorder of trigeminal nerve, unspecified: Secondary | ICD-10-CM | POA: Diagnosis not present

## 2018-04-11 DIAGNOSIS — R7303 Prediabetes: Secondary | ICD-10-CM | POA: Diagnosis not present

## 2018-04-11 DIAGNOSIS — E7800 Pure hypercholesterolemia, unspecified: Secondary | ICD-10-CM | POA: Diagnosis not present

## 2018-05-20 ENCOUNTER — Other Ambulatory Visit: Payer: Self-pay | Admitting: Certified Nurse Midwife

## 2018-05-20 NOTE — Telephone Encounter (Signed)
Medication refill request: levono-E Estrad  Last AEX: 09/18/17  Next AEX: 09/24/18  Last MMG (if hormonal medication request): 10/10/17 Bi-rads Category 2 Benign  Refill authorized: Refill if appropriate.

## 2018-09-24 ENCOUNTER — Encounter: Payer: Self-pay | Admitting: Certified Nurse Midwife

## 2018-09-24 ENCOUNTER — Ambulatory Visit (INDEPENDENT_AMBULATORY_CARE_PROVIDER_SITE_OTHER): Payer: BLUE CROSS/BLUE SHIELD | Admitting: Certified Nurse Midwife

## 2018-09-24 ENCOUNTER — Other Ambulatory Visit: Payer: Self-pay

## 2018-09-24 VITALS — BP 110/64 | HR 68 | Resp 16 | Ht 59.5 in | Wt 168.0 lb

## 2018-09-24 DIAGNOSIS — Z3041 Encounter for surveillance of contraceptive pills: Secondary | ICD-10-CM | POA: Diagnosis not present

## 2018-09-24 DIAGNOSIS — Z01419 Encounter for gynecological examination (general) (routine) without abnormal findings: Secondary | ICD-10-CM

## 2018-09-24 DIAGNOSIS — Z0000 Encounter for general adult medical examination without abnormal findings: Secondary | ICD-10-CM | POA: Diagnosis not present

## 2018-09-24 DIAGNOSIS — E559 Vitamin D deficiency, unspecified: Secondary | ICD-10-CM

## 2018-09-24 MED ORDER — LEVONORGEST-ETH ESTRAD 91-DAY 0.15-0.03 &0.01 MG PO TABS: 1.0000 | ORAL_TABLET | Freq: Every day | ORAL | 4 refills | Status: DC

## 2018-09-24 NOTE — Patient Instructions (Signed)

## 2018-09-24 NOTE — Progress Notes (Signed)
42 y.o. G22P1001 Married  Caucasian Fe here for annual exam. Periods normal, no issues. Contraception working well, no warning signs. Continues with headaches, but no changes and no aura. Working on weight loss and exercise. Considering breast reduction due back and neck pain. No other health issues today.  Patient's last menstrual period was 09/13/2018 (exact date).          Sexually active: Yes.    The current method of family planning is OCP (estrogen/progesterone).    Exercising: Yes.    walking Smoker:  no  Review of Systems  Constitutional:       Weight gain  HENT:       Headache  Eyes: Negative.   Respiratory: Negative.   Cardiovascular: Negative.   Gastrointestinal: Negative.   Genitourinary: Negative.   Musculoskeletal:       Muscle or joint pain  Skin: Negative.   Neurological: Negative.   Endo/Heme/Allergies: Negative.   Psychiatric/Behavioral: Negative.     Health Maintenance: Pap:  09-14-16 neg, 09-18-17 neg HPV HR neg History of Abnormal Pap: yes MMG:  10-10-17 category b density birads 2:neg Self Breast exams: no Colonoscopy:  none BMD:   none TDaP:  2014 Shingles: no Pneumonia: no Hep C and HIV: not done Labs: yes   reports that she has never smoked. She has never used smokeless tobacco. She reports that she drinks alcohol. She reports that she does not use drugs.  Past Medical History:  Diagnosis Date  . Abnormal Pap smear 2004   Hx of abnormal pap smear   . Headache(784.0) 04/17/12   "maybe once/wk"  . Sleep disturbance, unspecified    "just not sleeping well sometimes"    Past Surgical History:  Procedure Laterality Date  . 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 CHOLECYSTECTOMY  04/17/2012   Procedure: LAPAROSCOPIC CHOLECYSTECTOMY WITH INTRAOPERATIVE CHOLANGIOGRAM;  Surgeon: Adin Hector, MD;  Location: [facility];  Service: General;  Laterality: N/A;  . DILATION AND CURETTAGE OF UTERUS  ~ 2002     Current Outpatient Medications  Medication Sig Dispense Refill  . ibuprofen (ADVIL,MOTRIN) 200 MG tablet Take 200-800 mg by mouth every 6 (six) hours as needed. Headache or pain    . Levonorgestrel-Ethinyl Estradiol (AMETHIA,CAMRESE) 0.15-0.03 &0.01 MG tablet TAKE 1 TABLET BY MOUTH EVERY DAY 91 tablet 1   No current facility-administered medications for this visit.     Family History  Problem Relation Age of Onset  . Hyperlipidemia Mother   . Arthritis Mother   . Stroke Father     ROS:  Pertinent items are noted in HPI.  Otherwise, a comprehensive ROS was negative.  Exam:   BP 110/64   Pulse 68   Resp 16   Ht 4' 11.5" (1.511 m)   Wt 168 lb (76.2 kg)   LMP 09/13/2018 (Exact Date)   BMI 33.36 kg/m  Height: 4' 11.5" (151.1 cm) Ht Readings from Last 3 Encounters:  09/24/18 4' 11.5" (1.511 m)  09/18/17 4' 11.25" (1.505 m)  01/12/17 4' 11.5" (1.511 m)    General appearance: alert, cooperative and appears stated age Head: Normocephalic, without obvious abnormality, atraumatic Neck: no adenopathy, supple, symmetrical, trachea midline and thyroid normal to inspection and palpation Lungs: clear to auscultation bilaterally Breasts: normal appearance, no masses or tenderness, No nipple retraction or dimpling, No nipple discharge or bleeding, No axillary or supraclavicular adenopathy, large pendulous bilateral Heart: regular rate and  rhythm Abdomen: soft, non-tender; no masses,  no organomegaly Extremities: extremities normal, atraumatic, no cyanosis or edema Skin: Skin color, texture, turgor normal. No rashes or lesions Lymph nodes: Cervical, supraclavicular, and axillary nodes normal. No abnormal inguinal nodes palpated Neurologic: Grossly normal   Pelvic: External genitalia:  no lesions, normal female              Urethra:  normal appearing urethra with no masses, tenderness or lesions              Bartholin's and Skene's: normal                 Vagina: normal appearing  vagina with normal color and discharge, no lesions              Cervix: no cervical motion tenderness and no lesions              Pap taken: No. Bimanual Exam:  Uterus:  normal size, contour, position, consistency, mobility, non-tender              Adnexa: normal adnexa and no mass, fullness, tenderness               Rectovaginal: Confirms               Anus:  normal sphincter tone, no lesions  Chaperone present: yes  A:  Well Woman with normal exam  Contraception OCP desired  Weight loss in progress  History of abnormal pap smear   Screening labs  P:   Reviewed health and wellness pertinent to exam  Risks/benefits/warning signs of OCP reviewed, desires continuance  Rx Amethia see order with instructions  Encouraged to add weight training to weight loss regimen  Labs: Vitamin D, CMP,Lipid panel, CBC,TSH  Pap smear: no   counseled on breast self exam, mammography screening, use and side effects of OCP's, adequate intake of calcium and vitamin D, diet and exercise  return annually or prn  An After Visit Summary was printed and given to the patient.

## 2018-09-25 LAB — LIPID PANEL
CHOLESTEROL TOTAL: 187 mg/dL (ref 100–199)
Chol/HDL Ratio: 4.9 ratio — ABNORMAL HIGH (ref 0.0–4.4)
HDL: 38 mg/dL — AB (ref >39)
LDL CALC: 95 mg/dL (ref 0–99)
TRIGLYCERIDES: 272 mg/dL — AB (ref 0–149)
VLDL CHOLESTEROL CAL: 54 mg/dL — AB (ref 5–40)

## 2018-09-25 LAB — COMPREHENSIVE METABOLIC PANEL
ALBUMIN: 3.9 g/dL (ref 3.5–5.5)
ALT: 13 IU/L (ref 0–32)
AST: 11 IU/L (ref 0–40)
Albumin/Globulin Ratio: 1.6 (ref 1.2–2.2)
Alkaline Phosphatase: 47 IU/L (ref 39–117)
BUN / CREAT RATIO: 14 (ref 9–23)
BUN: 11 mg/dL (ref 6–24)
Bilirubin Total: 0.2 mg/dL (ref 0.0–1.2)
CALCIUM: 9 mg/dL (ref 8.7–10.2)
CO2: 23 mmol/L (ref 20–29)
Chloride: 101 mmol/L (ref 96–106)
Creatinine, Ser: 0.79 mg/dL (ref 0.57–1.00)
GFR, EST AFRICAN AMERICAN: 105 mL/min/{1.73_m2} (ref >59)
GFR, EST NON AFRICAN AMERICAN: 91 mL/min/{1.73_m2} (ref >59)
Globulin, Total: 2.5 g/dL (ref 1.5–4.5)
Glucose: 77 mg/dL (ref 65–99)
Potassium: 3.9 mmol/L (ref 3.5–5.2)
Sodium: 138 mmol/L (ref 134–144)
TOTAL PROTEIN: 6.4 g/dL (ref 6.0–8.5)

## 2018-09-25 LAB — CBC
HEMATOCRIT: 39.4 % (ref 34.0–46.6)
HEMOGLOBIN: 13.5 g/dL (ref 11.1–15.9)
MCH: 31.8 pg (ref 26.6–33.0)
MCHC: 34.3 g/dL (ref 31.5–35.7)
MCV: 93 fL (ref 79–97)
Platelets: 282 10*3/uL (ref 150–450)
RBC: 4.25 x10E6/uL (ref 3.77–5.28)
RDW: 12.1 % — AB (ref 12.3–15.4)
WBC: 7.4 10*3/uL (ref 3.4–10.8)

## 2018-09-25 LAB — TSH: TSH: 1.65 u[IU]/mL (ref 0.450–4.500)

## 2018-09-25 LAB — VITAMIN D 25 HYDROXY (VIT D DEFICIENCY, FRACTURES): VIT D 25 HYDROXY: 42.1 ng/mL (ref 30.0–100.0)

## 2018-09-26 ENCOUNTER — Telehealth: Payer: Self-pay | Admitting: *Deleted

## 2018-09-26 NOTE — Telephone Encounter (Signed)
Notes recorded by Burnice Logan, RN on 09/26/2018 at 3:09 PM EST Left message to call Sharee Pimple, RN at [facility].

## 2018-09-26 NOTE — Telephone Encounter (Signed)
-----   Message from Regina Eck, CNM sent at 09/26/2018  7:56 AM EST ----- Notify patient her Lipid panel shows normal cholesterol Triglycerides are elevated at 272, normal is <149 HDL is borderline low at 38 this is protective cholesterol, work on exercise normal is>39 VLDL is elevated at 54 this is harmful lipid, normal <40 Due to risk ration of 4.9 feel she needs PCP management   Please help with this if she does not have PCP.  Work on decrease carbohydrates and fried foods. TSH is  Normal Vitamin D good at 42.1 Liver, kidney and glucose panel is normal CBC is essentially normal, no anemia I did request aHgb A1-C to make sure no diabetic risk, not in yet

## 2018-09-27 NOTE — Telephone Encounter (Signed)
Spoke with patient, advised of all results as seen below per Melvia Heaps, CNM. Copy of labs dated 09/24/18 faxed via Epic to PCP, Webb Silversmith, NP. Patient states she will call to schedule f/u.  Patient verbalizes understanding and is agreeable.   Encounter closed.

## 2018-09-27 NOTE — Telephone Encounter (Signed)
-----   Message from Regina Eck, CNM sent at 09/27/2018  7:40 AM EST ----- Notify patient Hgb A1-C was normal, no diabetes concerns. See previous note

## 2018-09-30 LAB — SPECIMEN STATUS REPORT

## 2018-09-30 LAB — HGB A1C W/O EAG: HEMOGLOBIN A1C: 5.5 % (ref 4.8–5.6)

## 2018-10-22 DIAGNOSIS — Z1231 Encounter for screening mammogram for malignant neoplasm of breast: Secondary | ICD-10-CM | POA: Diagnosis not present

## 2018-12-09 ENCOUNTER — Telehealth: Payer: Self-pay | Admitting: Certified Nurse Midwife

## 2018-12-09 NOTE — Telephone Encounter (Signed)
Patient called requesting a letter from Melvia Heaps, CNM stating they have discussed her back pain related to her breast size. She said she is considering having a reduction and needs a letter for her insurance company.

## 2018-12-10 ENCOUNTER — Encounter: Payer: Self-pay | Admitting: Emergency Medicine

## 2018-12-10 NOTE — Telephone Encounter (Signed)
Letter written and to Melvia Heaps CNM for review.

## 2018-12-10 NOTE — Telephone Encounter (Signed)
Letter at front desk for pick up at patient convenience.  Pt aware.

## 2018-12-24 ENCOUNTER — Encounter: Payer: Self-pay | Admitting: Internal Medicine

## 2018-12-24 ENCOUNTER — Ambulatory Visit (INDEPENDENT_AMBULATORY_CARE_PROVIDER_SITE_OTHER): Payer: BLUE CROSS/BLUE SHIELD | Admitting: Internal Medicine

## 2018-12-24 VITALS — BP 110/76 | HR 67 | Temp 98.0°F | Ht 60.0 in | Wt 174.0 lb

## 2018-12-24 DIAGNOSIS — R51 Headache: Secondary | ICD-10-CM

## 2018-12-24 DIAGNOSIS — G8929 Other chronic pain: Secondary | ICD-10-CM | POA: Diagnosis not present

## 2018-12-24 DIAGNOSIS — M549 Dorsalgia, unspecified: Secondary | ICD-10-CM

## 2018-12-24 DIAGNOSIS — N62 Hypertrophy of breast: Secondary | ICD-10-CM | POA: Diagnosis not present

## 2018-12-24 DIAGNOSIS — R519 Headache, unspecified: Secondary | ICD-10-CM

## 2018-12-24 DIAGNOSIS — M5489 Other dorsalgia: Secondary | ICD-10-CM

## 2018-12-24 MED ORDER — TOPIRAMATE 25 MG PO CPSP: 25.0000 mg | ORAL_CAPSULE | Freq: Every day | ORAL | 0 refills | Status: DC

## 2018-12-24 NOTE — Progress Notes (Signed)
Subjective:    Patient ID: Phyllis Morales, female    DOB: 03-13-1976, 43 y.o.   MRN: [id number]  HPI  Pt presents to the clinic today to discuss a few issues.  1- She has been having back pain. This has been going on for months. She describes the pain as sore and achy. She denies any injury to the area. She reports she has had a MRI done by orthopedics and they found nothing wrong. She thinks this is related to her large breasts, and is considering getting a breast reduction. She has tried NSAID's, muscle relaxers, stretching with minimal relief.  2- Frequent Headaches: These are occurring 3 x week. They are located in the back of her head. The pain radiates down the left side of her neck. She reports they are triggered by stress, food and lack of sleep. She takes Diclofenac and Baclofen as needed with some relief. She has seen a neurologist in the past, but is not currently seeing anyone.  Review of Systems      Past Medical History:  Diagnosis Date  . Abnormal Pap smear 2004   Hx of abnormal pap smear   . Headache(784.0) 04/17/12   "maybe once/wk"  . Migraines   . Sleep disturbance, unspecified    "just not sleeping well sometimes"    Current Outpatient Medications  Medication Sig Dispense Refill  . ibuprofen (ADVIL,MOTRIN) 200 MG tablet Take 200-800 mg by mouth every 6 (six) hours as needed. Headache or pain    . Levonorgestrel-Ethinyl Estradiol (AMETHIA,CAMRESE) 0.15-0.03 &0.01 MG tablet Take 1 tablet by mouth daily. 91 tablet 4   No current facility-administered medications for this visit.     No Known Allergies  Family History  Problem Relation Age of Onset  . Hyperlipidemia Mother   . Arthritis Mother   . Stroke Father     Social History   Socioeconomic History  . Marital status: Married    Spouse name: Not on file  . Number of children: Not on file  . Years of education: Not on file  . Highest education level: Not on file  Occupational History  . Not on  file  Social Needs  . Financial resource strain: Not on file  . Food insecurity:    Worry: Not on file    Inability: Not on file  . Transportation needs:    Medical: Not on file    Non-medical: Not on file  Tobacco Use  . Smoking status: Never Smoker  . Smokeless tobacco: Never Used  Substance and Sexual Activity  . Alcohol use: Yes    Comment: 2-3 a month  . Drug use: No  . Sexual activity: Yes    Partners: Male    Birth control/protection: Pill  Lifestyle  . Physical activity:    Days per week: Not on file    Minutes per session: Not on file  . Stress: Not on file  Relationships  . Social connections:    Talks on phone: Not on file    Gets together: Not on file    Attends religious service: Not on file    Active member of club or organization: Not on file    Attends meetings of clubs or organizations: Not on file    Relationship status: Not on file  . Intimate partner violence:    Fear of current or ex partner: Not on file    Emotionally abused: Not on file    Physically abused: Not on file  Forced sexual activity: Not on file  Other Topics Concern  . Not on file  Social History Narrative  . Not on file     Constitutional: Pt reports headaches. Denies fever, malaise, fatigue, or abrupt weight changes.  HEENT: Denies eye pain, eye redness, ear pain, ringing in the ears, wax buildup, runny nose, nasal congestion, bloody nose, or sore throat. Musculoskeletal: Pt reports back pain. Denies decrease in range of motion, difficulty with gait, muscle pain or joint swelling.  Neurological: Denies dizziness, difficulty with memory, difficulty with speech or problems with balance and coordination.    No other specific complaints in a complete review of systems (except as listed in HPI above).  Objective:   Physical Exam  BP 110/76 (BP Location: Left Arm, Patient Position: Sitting, Cuff Size: Large)   Pulse 67   Temp 98 F (36.7 C) (Oral)   Ht 5' (1.524 m)   Wt 174  lb (78.9 kg)   SpO2 97%   BMI 33.98 kg/m  Wt Readings from Last 3 Encounters:  12/24/18 174 lb (78.9 kg)  09/24/18 168 lb (76.2 kg)  09/18/17 166 lb (75.3 kg)    General: Appears her stated age, obese, in NAD. Skin: Indention noted in bilateral shoulders from bra. HEENT: Head: normal shape and size; Eyes: sclera white, no icterus, conjunctiva pink, PERRLA and EOMs intact;  Cardiovascular: Normal rate and rhythm.  Pulmonary/Chest: Normal effort and positive vesicular breath sounds. No respiratory distress. No wheezes, rales or ronchi noted.  Musculoskeletal: Normal flexion, extension and rotation of the spine. No bony tenderness noted over the spine. Pain with palpation of bilateral para thoracic muscles. Strength 5/5 BUE.BLE. Neurological: Alert and oriented. Cranial nerves II-XII grossly intact. Coordination normal.    BMET    Component Value Date/Time   NA 138 09/24/2018 1545   K 3.9 09/24/2018 1545   CL 101 09/24/2018 1545   CO2 23 09/24/2018 1545   GLUCOSE 77 09/24/2018 1545   GLUCOSE 83 09/14/2016 1544   BUN 11 09/24/2018 1545   CREATININE 0.79 09/24/2018 1545   CREATININE 0.86 09/14/2016 1544   CALCIUM 9.0 09/24/2018 1545   GFRNONAA 91 09/24/2018 1545   GFRAA 105 09/24/2018 1545    Lipid Panel     Component Value Date/Time   CHOL 187 09/24/2018 1545   TRIG 272 (H) 09/24/2018 1545   HDL 38 (L) 09/24/2018 1545   CHOLHDL 4.9 (H) 09/24/2018 1545   CHOLHDL 6.3 (H) 09/14/2016 1544   VLDL 57 (H) 09/14/2016 1544   Strang 95 09/24/2018 1545    CBC    Component Value Date/Time   WBC 7.4 09/24/2018 1545   WBC 6.0 09/14/2016 1544   RBC 4.25 09/24/2018 1545   RBC 4.33 09/14/2016 1544   HGB 13.5 09/24/2018 1545   HGB 13.9 12/03/2014 1302   HCT 39.4 09/24/2018 1545   PLT 282 09/24/2018 1545   MCV 93 09/24/2018 1545   MCH 31.8 09/24/2018 1545   MCH 31.4 09/14/2016 1544   MCHC 34.3 09/24/2018 1545   MCHC 33.0 09/14/2016 1544   RDW 12.1 (L) 09/24/2018 1545    LYMPHSABS 1.0 04/17/2012 0745   MONOABS 0.3 04/17/2012 0745   EOSABS 0.0 04/17/2012 0745   BASOSABS 0.0 04/17/2012 0745    Hgb A1C Lab Results  Component Value Date   HGBA1C 5.5 09/24/2018            Assessment & Plan:

## 2018-12-25 ENCOUNTER — Telehealth: Payer: Self-pay | Admitting: *Deleted

## 2018-12-25 NOTE — Telephone Encounter (Signed)
Left message on voicemail.

## 2018-12-25 NOTE — Telephone Encounter (Signed)
Please thank the patient for this information. We can try a medication such as amitriptyline at bedtime for headaches.  Has she ever had this before?  If she is agreeable and has not had this before and I will send a prescription to her pharmacy.  Have her follow-up with [facility] as recommended. Let me know.

## 2018-12-25 NOTE — Telephone Encounter (Signed)
Patient called stating that Topamax was sent in for her headaches. Patient stated that the pharmacist told her that this is the same medication that she had to stop because it caused numbness in her hands and feet. Patient stated that her headache doctor had given this to her and she is not seeing him anymore. [facility] : [facility]

## 2018-12-25 NOTE — Telephone Encounter (Signed)
Patient returned Phyllis Morales's call.  Please call patient back at [phone].

## 2018-12-26 ENCOUNTER — Encounter: Payer: Self-pay | Admitting: Internal Medicine

## 2018-12-26 DIAGNOSIS — M549 Dorsalgia, unspecified: Secondary | ICD-10-CM | POA: Insufficient documentation

## 2018-12-26 MED ORDER — AMITRIPTYLINE HCL 25 MG PO TABS: 25.0000 mg | ORAL_TABLET | Freq: Every day | ORAL | 1 refills | Status: DC

## 2018-12-26 NOTE — Assessment & Plan Note (Signed)
Has seen orthopedic in the past Negative MRI Could be due to large breasts Would recommend reduction- letter written

## 2018-12-26 NOTE — Telephone Encounter (Signed)
Pt left vm at triage requesting a call back regarding topamax Rx

## 2018-12-26 NOTE — Addendum Note (Signed)
Addended by: Jearld Fenton on: 12/26/2018 09:22 PM   Modules accepted: Orders

## 2018-12-26 NOTE — Telephone Encounter (Signed)
Pt states she has tried Amitriptyline in the pass and failed, pt states she can not take Topamax due to side effects... per verbal order Garnette Gunner states the only alternative is Propanolol or restart inj with Neurology.... pt can not take Propranol due to low heart rate so injections is the only other option  Pt states she would like to retry the amitriptyline to make sure it will not work before restarting inj.... pt request 90 day supply sent to pharmacy as her insurance will be expensive for anything less

## 2018-12-26 NOTE — Assessment & Plan Note (Signed)
Discussed treating headaches with preventative therapy such as beta blocker, topamax and amitriptyline Will trial Topamax 25 mg daily Continue Diclofenac and Baclofen as needed Advised her to keep a headache diary

## 2018-12-26 NOTE — Patient Instructions (Signed)

## 2018-12-26 NOTE — Telephone Encounter (Signed)
Amitriptyline sent to pharmacy

## 2019-01-07 ENCOUNTER — Telehealth: Payer: Self-pay

## 2019-01-07 NOTE — Telephone Encounter (Signed)
Pt left v/m requesting cb for status of letter; v/m did not specify what type letter. Pt last seen 12/24/18 and noted letter for breast reduction was done but do not see under letter tab. Unable to reach pt to find out what type letter pt is requesting.

## 2019-01-08 NOTE — Telephone Encounter (Signed)
Patient called back today to find out about letter for insurance.  Patient said Phyllis Morales was going to write the letter for her breast reduction surgery. Her appointment with the surgeon is on Monday.  Patient can be reached at [phone].

## 2019-01-09 NOTE — Telephone Encounter (Signed)
I will have this ready for her tomorrow.

## 2019-01-10 NOTE — Telephone Encounter (Signed)
Pt calling and wanting to know status of letter. Please call pt when letter is ready to be pick'd up.

## 2019-01-10 NOTE — Telephone Encounter (Signed)
Letter is done, printed and placed up front

## 2019-01-10 NOTE — Telephone Encounter (Signed)
Pt is aware as instructed 

## 2019-01-13 DIAGNOSIS — N62 Hypertrophy of breast: Secondary | ICD-10-CM | POA: Diagnosis not present

## 2019-01-20 DIAGNOSIS — M5010 Cervical disc disorder with radiculopathy, unspecified cervical region: Secondary | ICD-10-CM | POA: Diagnosis not present

## 2019-01-20 DIAGNOSIS — M5134 Other intervertebral disc degeneration, thoracic region: Secondary | ICD-10-CM | POA: Diagnosis not present

## 2019-01-20 DIAGNOSIS — M5136 Other intervertebral disc degeneration, lumbar region: Secondary | ICD-10-CM | POA: Diagnosis not present

## 2019-03-10 ENCOUNTER — Telehealth: Payer: Self-pay | Admitting: Internal Medicine

## 2019-03-26 ENCOUNTER — Telehealth: Payer: Self-pay | Admitting: Internal Medicine

## 2019-03-26 NOTE — Telephone Encounter (Signed)
Pt called requesting a letter excusing her from jury duty due to her neck and back pain. Requesting it to be mailed to her.

## 2019-03-27 NOTE — Telephone Encounter (Signed)
We need more info. We need her juror number and date of service.

## 2019-04-10 NOTE — Telephone Encounter (Signed)
Pt called and said could you call her back b/c she needs note. She didn't have the juror information with her.  She needs letter by 04/16/19.  CB [phone]

## 2019-04-11 NOTE — Telephone Encounter (Signed)
Pt stated she will call back with needed info

## 2019-04-11 NOTE — Telephone Encounter (Signed)
Pt called back with info. Date of Service is 6/8. And the juror number is [phone].

## 2019-04-14 HISTORY — PX: BREAST REDUCTION SURGERY: SHX8

## 2019-04-14 NOTE — Telephone Encounter (Signed)
Patient stated that she sent an email  and faxed the form back over to our office that was needed in order for [place] to send her a letter excusing her from jury duty.  She is wanting this letter fax to her as soon as possible  [phone]

## 2019-04-15 NOTE — Telephone Encounter (Signed)
Received med release form for faxing the letter... pt is aware via email

## 2019-04-23 DIAGNOSIS — Z411 Encounter for cosmetic surgery: Secondary | ICD-10-CM | POA: Diagnosis not present

## 2019-04-23 DIAGNOSIS — N62 Hypertrophy of breast: Secondary | ICD-10-CM | POA: Diagnosis not present

## 2019-04-29 ENCOUNTER — Other Ambulatory Visit: Payer: Self-pay | Admitting: Plastic Surgery

## 2019-04-29 DIAGNOSIS — N6011 Diffuse cystic mastopathy of right breast: Secondary | ICD-10-CM | POA: Diagnosis not present

## 2019-04-29 DIAGNOSIS — N62 Hypertrophy of breast: Secondary | ICD-10-CM | POA: Diagnosis not present

## 2019-04-29 DIAGNOSIS — Z411 Encounter for cosmetic surgery: Secondary | ICD-10-CM | POA: Diagnosis not present

## 2019-04-29 DIAGNOSIS — E65 Localized adiposity: Secondary | ICD-10-CM | POA: Diagnosis not present

## 2019-04-29 DIAGNOSIS — N6012 Diffuse cystic mastopathy of left breast: Secondary | ICD-10-CM | POA: Diagnosis not present

## 2019-05-20 ENCOUNTER — Telehealth: Payer: Self-pay | Admitting: *Deleted

## 2019-05-20 NOTE — Telephone Encounter (Signed)
Patient called stating that she, her husband and son were tested for Covid thru [facility] [place]. Felissa stated that her husband has gotten his results back, but she and her son have not. Patient stated that she listed Webb Silversmith NP as her PCP and was wondering if the results have been sent to her? Advised patient that no results have been put in our system .

## 2019-05-21 NOTE — Telephone Encounter (Signed)
Pt said that her son got his covid test results emailed to him this morning and he was positive for covid. Pt said she is aware about quarantining but pt has not gotten her covid results back yet and was supposed to have results on 05/19/19.pt spoke with her employer and was advised for PCP to get results so pt can have a copy to give to her employer. Pt does not have a contact number for getting the results or talking to the place where testing was done. Pt has sent 10 messages to the place where covid testing was done with no response.  Pt wants to see if R BaityNP can help her get covid results. Also can we give out results done at another facility? Pt also wanted to know our procedure for testing for covid. I advised pt of our procedure but also advised if her insurance was charged for covid testing done at [facility] [employer] was not sure if ins would pay for covid testing done in short period of time. Pt should ck with ins co. Pt request cb about first trying to get results from covid testing already done.

## 2019-05-22 NOTE — Telephone Encounter (Signed)
They are not in Epic. I have no idea how to get these results. I'm sorry that she has been unable to get in touch with them but I would recommend she keep trying.

## 2019-05-22 NOTE — Telephone Encounter (Signed)
Addressed with mother on pt's actual chart.

## 2019-07-25 ENCOUNTER — Other Ambulatory Visit: Payer: Self-pay | Admitting: Internal Medicine

## 2019-07-25 NOTE — Telephone Encounter (Signed)
Pt is at [facility] now; has migraine and is completely out of med. Refilled med per protocol and pt notified done by [facility].

## 2019-09-26 ENCOUNTER — Other Ambulatory Visit: Payer: Self-pay

## 2019-09-26 ENCOUNTER — Encounter: Payer: Self-pay | Admitting: Certified Nurse Midwife

## 2019-09-26 ENCOUNTER — Ambulatory Visit (INDEPENDENT_AMBULATORY_CARE_PROVIDER_SITE_OTHER): Payer: BC Managed Care – PPO | Admitting: Certified Nurse Midwife

## 2019-09-26 ENCOUNTER — Other Ambulatory Visit (HOSPITAL_COMMUNITY)
Admission: RE | Admit: 2019-09-26 | Discharge: 2019-09-26 | Disposition: A | Discharge status: Home / Self Care | Payer: BC Managed Care – PPO | Source: Ambulatory Visit | Attending: Obstetrics & Gynecology | Admitting: Obstetrics & Gynecology

## 2019-09-26 VITALS — BP 110/72 | HR 64 | Temp 97.1°F | Resp 16 | Ht 59.25 in | Wt 168.0 lb

## 2019-09-26 DIAGNOSIS — Z124 Encounter for screening for malignant neoplasm of cervix: Secondary | ICD-10-CM

## 2019-09-26 DIAGNOSIS — Z9889 Other specified postprocedural states: Secondary | ICD-10-CM

## 2019-09-26 DIAGNOSIS — Z3041 Encounter for surveillance of contraceptive pills: Secondary | ICD-10-CM

## 2019-09-26 DIAGNOSIS — Z8742 Personal history of other diseases of the female genital tract: Secondary | ICD-10-CM | POA: Insufficient documentation

## 2019-09-26 DIAGNOSIS — Z01419 Encounter for gynecological examination (general) (routine) without abnormal findings: Secondary | ICD-10-CM | POA: Diagnosis not present

## 2019-09-26 DIAGNOSIS — Z8669 Personal history of other diseases of the nervous system and sense organs: Secondary | ICD-10-CM

## 2019-09-26 MED ORDER — LEVONORGEST-ETH ESTRAD 91-DAY 0.15-0.03 &0.01 MG PO TABS: 1.0000 | ORAL_TABLET | Freq: Every day | ORAL | 4 refills | Status: DC

## 2019-09-26 NOTE — Progress Notes (Signed)
43 y.o. G36P1001 Married  Caucasian Fe here for annual exam. Periods normal,no issues. Continues to have migraines headaches no aura ever that respond well to Elavil, will discuss with [facility]. Contraception working well , had a couple days of BTB only in the past year with OCP, desires continuance.Sees PCP Webb Silversmith NP for aex, labs. Had breast reduction bilateral in 04/2019, adjusting to change. No other health issues today.  Patient's last menstrual period was 09/20/2019 (exact date).          Sexually active: Yes.    The current method of family planning is OCP (estrogen/progesterone).    Exercising: No.  exercise Smoker:  no  Review of Systems  Constitutional: Negative.   HENT: Negative.   Eyes: Negative.   Respiratory: Negative.   Cardiovascular: Negative.   Gastrointestinal: Negative.   Genitourinary: Negative.   Musculoskeletal: Negative.   Skin: Negative.   Neurological: Negative.   Endo/Heme/Allergies: Negative.   Psychiatric/Behavioral: Negative.     Health Maintenance: Pap:  09-18-17 neg HPV HR NEG History of Abnormal Pap: yes MMG:  10-22-2018 category a density birads 1:neg Self Breast exams: no Colonoscopy: age 5 BMD:   none TDaP:  2014 Shingles: no Pneumonia: no Hep C and HIV: not done Labs: if needed   reports that she has never smoked. She has never used smokeless tobacco. She reports current alcohol use. She reports that she does not use drugs.  Past Medical History:  Diagnosis Date  . Abnormal Pap smear 2004   Hx of abnormal pap smear   . Headache(784.0) 04/17/12   "maybe once/wk"  . Migraines   . Sleep disturbance, unspecified    "just not sleeping well sometimes"    Past Surgical History:  Procedure Laterality Date  . BREAST REDUCTION SURGERY  04/2019  . 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 CHOLECYSTECTOMY  04/17/2012   Procedure: LAPAROSCOPIC CHOLECYSTECTOMY WITH  INTRAOPERATIVE CHOLANGIOGRAM;  Surgeon: Adin Hector, MD;  Location: [facility];  Service: General;  Laterality: N/A;  . DILATION AND CURETTAGE OF UTERUS  ~ 2002    Current Outpatient Medications  Medication Sig Dispense Refill  . amitriptyline (ELAVIL) 25 MG tablet TAKE 1 TABLET(25 MG) BY MOUTH AT BEDTIME 90 tablet 0  . ibuprofen (ADVIL,MOTRIN) 200 MG tablet Take 200-800 mg by mouth every 6 (six) hours as needed. Headache or pain    . Levonorgestrel-Ethinyl Estradiol (AMETHIA,CAMRESE) 0.15-0.03 &0.01 MG tablet Take 1 tablet by mouth daily. 91 tablet 4   No current facility-administered medications for this visit.     Family History  Problem Relation Age of Onset  . Hyperlipidemia Mother   . Arthritis Mother   . Stroke Father     ROS:  Pertinent items are noted in HPI.  Otherwise, a comprehensive ROS was negative.  Exam:   BP 110/72   Pulse 64   Temp (!) 97.1 F (36.2 C) (Skin)   Resp 16   Ht 4' 11.25" (1.505 m)   Wt 168 lb (76.2 kg)   LMP 09/20/2019 (Exact Date)   BMI 33.65 kg/m  Height: 4' 11.25" (150.5 cm) Ht Readings from Last 3 Encounters:  09/26/19 4' 11.25" (1.505 m)  12/24/18 5' (1.524 m)  09/24/18 4' 11.5" (1.511 m)    General appearance: alert, cooperative and appears stated age Head: Normocephalic, without obvious abnormality, atraumatic Neck: no adenopathy, supple, symmetrical, trachea midline and thyroid  normal to inspection and palpation Lungs: clear to auscultation bilaterally Breasts: normal appearance, no masses or tenderness, No nipple retraction or dimpling, No nipple discharge or bleeding, No axillary or supraclavicular adenopathy, Normal to palpation without dominant masses, bilateral minimal scarring from reduction Heart: regular rate and rhythm Abdomen: soft, non-tender; no masses,  no organomegaly Extremities: extremities normal, atraumatic, no cyanosis or edema Skin: Skin color, texture, turgor normal. No rashes or lesions Lymph nodes: Cervical,  supraclavicular, and axillary nodes normal. No abnormal inguinal nodes palpated Neurologic: Grossly normal   Pelvic: External genitalia:  no lesions              Urethra:  normal appearing urethra with no masses, tenderness or lesions              Bartholin's and Skene's: normal                 Vagina: normal appearing vagina with normal color and discharge, no lesions              Cervix: no cervical motion tenderness, no lesions and normal appearance              Pap taken: Yes.   Bimanual Exam:  Uterus:  normal size, contour, position, consistency, mobility, non-tender and retroverted              Adnexa: normal adnexa and no mass, fullness, tenderness               Rectovaginal: Confirms               Anus:  normal sphincter tone, no lesions  Chaperone present: yes  A:  Well Woman with normal exam  Contraception OCP working well, no warning signs.  Migraine headache no aura medication working well with MD management  Breast reduction bilateral adjusting to change.  P:   Reviewed health and wellness pertinent to exam  Risks/benefits/warning signs with OCP use. If Migraine with aura occurs will need to advise and change OCP to POP. Patient aware.  Rx Amethia see order with instructions  Continue follow up with MD  Mammogram due end of year, stressed doing yearly and SBE.  Pap smear: yes   counseled on breast self exam, mammography screening, feminine hygiene, adequate intake of calcium and vitamin D, diet and exercise  return annually or prn  An After Visit Summary was printed and given to the patient.

## 2019-09-26 NOTE — Patient Instructions (Signed)

## 2019-09-29 LAB — CYTOLOGY - PAP: Diagnosis: NEGATIVE

## 2019-10-27 ENCOUNTER — Other Ambulatory Visit: Payer: Self-pay | Admitting: Internal Medicine

## 2019-10-28 ENCOUNTER — Encounter: Payer: Self-pay | Admitting: Certified Nurse Midwife

## 2019-10-28 DIAGNOSIS — Z1231 Encounter for screening mammogram for malignant neoplasm of breast: Secondary | ICD-10-CM | POA: Diagnosis not present

## 2019-12-02 ENCOUNTER — Telehealth: Payer: Self-pay | Admitting: *Deleted

## 2019-12-02 NOTE — Telephone Encounter (Signed)
Pt reports she has also been alternating Tylenol/ibuprofen during the day with no relief... Pt states she will give the 50mg  a try for the next couple of nights and let me know... I did let pt know I only work 8-1 on Fri and Rollene Fare is out of the office, so to call in the AM if she has no relief so that I can try to find out what would be next...   CPE scheduled

## 2019-12-02 NOTE — Telephone Encounter (Signed)
Have her take 2 (50 mg) and see if that makes a difference. Amitriptyline is used to prevent headaches. It won't abort a migraine if she currently has one. What is she taking for abortive therapy?

## 2019-12-02 NOTE — Telephone Encounter (Signed)
Patient left a voicemail stating that she has been taking Amitriptyline 25 mg for headaches and migraines. Patient stated that the 25 mg of the Amitriptyline does not seem to working any more. Patient stated that she is on day 2 of a real bad migraine. Patient wants to know if you will prescribe a higher dose of Amitriptyline? Patient stated that she does not know if her system has just gotten use to it or if it is not working any longer. Patient requested a call back. Pharmacy/[facility]

## 2019-12-03 NOTE — Telephone Encounter (Signed)
Agree with advice given

## 2019-12-15 NOTE — Telephone Encounter (Signed)
She is due for her CPE, have her schedule and we can discuss medications at that time

## 2019-12-15 NOTE — Telephone Encounter (Signed)
Patient stated that things are better since she started taking the Amitriptyline.  She said the 2 a night helps more than just 1.   She stated that she has only had 1 or 2 migraines but she would like a 800mg  Tylenol/Ibuprofen sent into her pharmacy

## 2019-12-17 MED ORDER — IBUPROFEN 800 MG PO TABS: 800.0000 mg | ORAL_TABLET | Freq: Every day | ORAL | 0 refills | Status: DC | PRN

## 2019-12-17 MED ORDER — AMITRIPTYLINE HCL 50 MG PO TABS: 50.0000 mg | ORAL_TABLET | Freq: Every day | ORAL | 0 refills | Status: DC

## 2019-12-17 NOTE — Telephone Encounter (Signed)
Patient is returning your call Gave a different contact number [phone]

## 2019-12-17 NOTE — Telephone Encounter (Signed)
Pt is aware as instructed... new Rx for 50mg  sent to pharmacy and pt requested 800mg  ibuprofen so that she did not have to take so many OTC ibuprofen.Phyllis KitchenMarland Morales

## 2019-12-17 NOTE — Addendum Note (Signed)
Addended by: Lurlean Nanny on: 12/17/2019 09:40 AM   Modules accepted: Orders

## 2020-01-12 ENCOUNTER — Encounter: Payer: BC Managed Care – PPO | Admitting: Internal Medicine

## 2020-01-30 ENCOUNTER — Encounter: Payer: Self-pay | Admitting: Certified Nurse Midwife

## 2020-02-11 ENCOUNTER — Ambulatory Visit (INDEPENDENT_AMBULATORY_CARE_PROVIDER_SITE_OTHER): Payer: BC Managed Care – PPO | Admitting: Internal Medicine

## 2020-02-11 ENCOUNTER — Other Ambulatory Visit: Payer: Self-pay

## 2020-02-11 ENCOUNTER — Encounter: Payer: Self-pay | Admitting: Internal Medicine

## 2020-02-11 VITALS — BP 110/74 | HR 69 | Temp 98.7°F | Ht 59.5 in | Wt 170.0 lb

## 2020-02-11 DIAGNOSIS — R519 Headache, unspecified: Secondary | ICD-10-CM

## 2020-02-11 DIAGNOSIS — Z0000 Encounter for general adult medical examination without abnormal findings: Secondary | ICD-10-CM

## 2020-02-11 DIAGNOSIS — K219 Gastro-esophageal reflux disease without esophagitis: Secondary | ICD-10-CM

## 2020-02-11 DIAGNOSIS — M549 Dorsalgia, unspecified: Secondary | ICD-10-CM

## 2020-02-11 DIAGNOSIS — Z1211 Encounter for screening for malignant neoplasm of colon: Secondary | ICD-10-CM

## 2020-02-11 DIAGNOSIS — G8929 Other chronic pain: Secondary | ICD-10-CM

## 2020-02-11 DIAGNOSIS — R635 Abnormal weight gain: Secondary | ICD-10-CM

## 2020-02-11 MED ORDER — OMEPRAZOLE 20 MG PO CPDR: 20.0000 mg | DELAYED_RELEASE_CAPSULE | Freq: Every day | ORAL | 3 refills | Status: DC

## 2020-02-11 NOTE — Patient Instructions (Signed)
Health Maintenance, Female Adopting a healthy lifestyle and getting preventive care are important in promoting health and wellness. Ask your health care provider about:  The right schedule for you to have regular tests and exams.  Things you can do on your own to prevent diseases and keep yourself healthy. What should I know about diet, weight, and exercise? Eat a healthy diet   Eat a diet that includes plenty of vegetables, fruits, low-fat dairy products, and lean protein.  Do not eat a lot of foods that are high in solid fats, added sugars, or sodium. Maintain a healthy weight Body mass index (BMI) is used to identify weight problems. It estimates body fat based on height and weight. Your health care provider can help determine your BMI and help you achieve or maintain a healthy weight. Get regular exercise Get regular exercise. This is one of the most important things you can do for your health. Most adults should:  Exercise for at least 150 minutes each week. The exercise should increase your heart rate and make you sweat (moderate-intensity exercise).  Do strengthening exercises at least twice a week. This is in addition to the moderate-intensity exercise.  Spend less time sitting. Even light physical activity can be beneficial. Watch cholesterol and blood lipids Have your blood tested for lipids and cholesterol at 46 years of age, then have this test every 5 years. Have your cholesterol levels checked more often if:  Your lipid or cholesterol levels are high.  You are older than 46 years of age.  You are at high risk for heart disease. What should I know about cancer screening? Depending on your health history and family history, you may need to have cancer screening at various ages. This may include screening for:  Breast cancer.  Cervical cancer.  Colorectal cancer.  Skin cancer.  Lung cancer. What should I know about heart disease, diabetes, and high blood  pressure? Blood pressure and heart disease  High blood pressure causes heart disease and increases the risk of stroke. This is more likely to develop in people who have high blood pressure readings, are of African descent, or are overweight.  Have your blood pressure checked: ? Every 3-5 years if you are 18-39 years of age. ? Every year if you are 40 years old or older. Diabetes Have regular diabetes screenings. This checks your fasting blood sugar level. Have the screening done:  Once every three years after age 40 if you are at a normal weight and have a low risk for diabetes.  More often and at a younger age if you are overweight or have a high risk for diabetes. What should I know about preventing infection? Hepatitis B If you have a higher risk for hepatitis B, you should be screened for this virus. Talk with your health care provider to find out if you are at risk for hepatitis B infection. Hepatitis C Testing is recommended for:  Everyone born from 1945 through 1965.  Anyone with known risk factors for hepatitis C. Sexually transmitted infections (STIs)  Get screened for STIs, including gonorrhea and chlamydia, if: ? You are sexually active and are younger than 46 years of age. ? You are older than 46 years of age and your health care provider tells you that you are at risk for this type of infection. ? Your sexual activity has changed since you were last screened, and you are at increased risk for chlamydia or gonorrhea. Ask your health care provider if   you are at risk.  Ask your health care provider about whether you are at high risk for HIV. Your health care provider may recommend a prescription medicine to help prevent HIV infection. If you choose to take medicine to prevent HIV, you should first get tested for HIV. You should then be tested every 3 months for as long as you are taking the medicine. Pregnancy  If you are about to stop having your period (premenopausal) and  you may become pregnant, seek counseling before you get pregnant.  Take 400 to 800 micrograms (mcg) of folic acid every day if you become pregnant.  Ask for birth control (contraception) if you want to prevent pregnancy. Osteoporosis and menopause Osteoporosis is a disease in which the bones lose minerals and strength with aging. This can result in bone fractures. If you are 65 years old or older, or if you are at risk for osteoporosis and fractures, ask your health care provider if you should:  Be screened for bone loss.  Take a calcium or vitamin D supplement to lower your risk of fractures.  Be given hormone replacement therapy (HRT) to treat symptoms of menopause. Follow these instructions at home: Lifestyle  Do not use any products that contain nicotine or tobacco, such as cigarettes, e-cigarettes, and chewing tobacco. If you need help quitting, ask your health care provider.  Do not use street drugs.  Do not share needles.  Ask your health care provider for help if you need support or information about quitting drugs. Alcohol use  Do not drink alcohol if: ? Your health care provider tells you not to drink. ? You are pregnant, may be pregnant, or are planning to become pregnant.  If you drink alcohol: ? Limit how much you use to 0-1 drink a day. ? Limit intake if you are breastfeeding.  Be aware of how much alcohol is in your drink. In the [place] one drink equals one 12 oz bottle of beer (355 mL), one 5 oz glass of wine (148 mL), or one 1 oz glass of hard liquor (44 mL). General instructions  Schedule regular health, dental, and eye exams.  Stay current with your vaccines.  Tell your health care provider if: ? You often feel depressed. ? You have ever been abused or do not feel safe at home. Summary  Adopting a healthy lifestyle and getting preventive care are important in promoting health and wellness.  Follow your health care provider's instructions about healthy  diet, exercising, and getting tested or screened for diseases.  Follow your health care provider's instructions on monitoring your cholesterol and blood pressure. This information is not intended to replace advice given to you by your health care provider. Make sure you discuss any questions you have with your health care provider. Document Revised: 10/23/2018 Document Reviewed: 10/23/2018 Elsevier Patient Education  2020 Elsevier Inc.  

## 2020-02-11 NOTE — Progress Notes (Signed)
Subjective:    Patient ID: Phyllis Morales, female    DOB: 1975-12-09, 44 y.o.   MRN: [id number]  HPI  Pt presents to the clinic today for her annual exam. She is also due to follow up chronic conditions.  Frequent Headaches: These occur 1 x week.. She takes Amitriptyline nightly and Ibuprofen for breakthrough with good relief. She does not follow with neurology.  Chronic Back Pain: Not an issue lately since her breast reduction.  GERD: Triggered by spicy, greasy and citric foods. She has been taking her husbands Omeprazole and would like to know if she could get a RX for this today.  Flu: never Tetanus: 11/2012 Covid: never Pap Smear: 09/2019 Mammogram: 10/2019 Colon Screening: never Vision Screening: as needed Dentist: annually  Diet: She does eat meat. She consumes fruits and veggies daily. She does eat fried foods. She drinks mostly water, Dt. Soda, sweet tea. Exercise: Walking 3 miles daily  Review of Systems      Past Medical History:  Diagnosis Date  . Abnormal Pap smear 2004   Hx of abnormal pap smear   . Headache(784.0) 04/17/12   "maybe once/wk"  . Migraines   . Sleep disturbance, unspecified    "just not sleeping well sometimes"    Current Outpatient Medications  Medication Sig Dispense Refill  . amitriptyline (ELAVIL) 50 MG tablet Take 1 tablet (50 mg total) by mouth at bedtime. 90 tablet 0  . ibuprofen (ADVIL) 800 MG tablet Take 1 tablet (800 mg total) by mouth daily as needed. 90 tablet 0  . Levonorgestrel-Ethinyl Estradiol (AMETHIA) 0.15-0.03 &0.01 MG tablet Take 1 tablet by mouth daily. 91 tablet 4   No current facility-administered medications for this visit.    Allergies  Allergen Reactions  . Topamax [Topiramate] Other (See Comments)    Numbness in hands and feet    Family History  Problem Relation Age of Onset  . Hyperlipidemia Mother   . Arthritis Mother   . Stroke Father     Social History   Socioeconomic History  . Marital  status: Married    Spouse name: Not on file  . Number of children: Not on file  . Years of education: Not on file  . Highest education level: Not on file  Occupational History  . Not on file  Tobacco Use  . Smoking status: Never Smoker  . Smokeless tobacco: Never Used  Substance and Sexual Activity  . Alcohol use: Yes    Comment: 2-3 a month  . Drug use: No  . Sexual activity: Yes    Partners: Male    Birth control/protection: Pill  Other Topics Concern  . Not on file  Social History Narrative  . Not on file   Social Determinants of Health   Financial Resource Strain:   . Difficulty of Paying Living Expenses:   Food Insecurity:   . Worried About Charity fundraiser in the Last Year:   . Arboriculturist in the Last Year:   Transportation Needs:   . Film/video editor (Medical):   Marland Kitchen Lack of Transportation (Non-Medical):   Physical Activity:   . Days of Exercise per Week:   . Minutes of Exercise per Session:   Stress:   . Feeling of Stress :   Social Connections:   . Frequency of Communication with Friends and Family:   . Frequency of Social Gatherings with Friends and Family:   . Attends Religious Services:   . Active Member  of Clubs or Organizations:   . Attends Archivist Meetings:   Marland Kitchen Marital Status:   Intimate Partner Violence:   . Fear of Current or Ex-Partner:   . Emotionally Abused:   Marland Kitchen Physically Abused:   . Sexually Abused:      Constitutional: Pt reports frequent headaches, abnormal weight gain. Denies fever, malaise, fatigue.  HEENT: Denies eye pain, eye redness, ear pain, ringing in the ears, wax buildup, runny nose, nasal congestion, bloody nose, or sore throat. Respiratory: Denies difficulty breathing, shortness of breath, cough or sputum production.   Cardiovascular: Denies chest pain, chest tightness, palpitations or swelling in the hands or feet.  Gastrointestinal: Pt reports reflux. Denies abdominal pain, bloating, constipation,  diarrhea or blood in the stool.  GU: Denies urgency, frequency, pain with urination, burning sensation, blood in urine, odor or discharge. Musculoskeletal: Denies decrease in range of motion, difficulty with gait, or joint pain and swelling.  Skin: Denies redness, rashes, lesions or ulcercations.  Neurological: Denies dizziness, difficulty with memory, difficulty with speech or problems with balance and coordination.  Psych: Denies anxiety, depression, SI/HI.  No other specific complaints in a complete review of systems (except as listed in HPI above).  Objective:   Physical Exam  BP 110/74   Pulse 69   Temp 98.7 F (37.1 C) (Temporal)   Ht 4' 11.5" (1.511 m)   Wt 170 lb (77.1 kg)   LMP 12/09/2019   SpO2 98%   BMI 33.76 kg/m   Wt Readings from Last 3 Encounters:  09/26/19 168 lb (76.2 kg)  12/24/18 174 lb (78.9 kg)  09/24/18 168 lb (76.2 kg)    General: Appears her stated age, obese, in NAD. Skin: Warm, dry and intact. No rashes, lesions or ulcerations noted. HEENT: Head: normal shape and size; Eyes: sclera white, no icterus, conjunctiva pink, PERRLA and EOMs intact; Neck:  Neck supple, trachea midline. No masses, lumps or thyromegaly present.  Cardiovascular: Normal rate and rhythm. S1,S2 noted.  No murmur, rubs or gallops noted. No JVD or BLE edema.  Pulmonary/Chest: Normal effort and positive vesicular breath sounds. No respiratory distress. No wheezes, rales or ronchi noted.  Abdomen: Soft and nontender. Normal bowel sounds. No distention or masses noted. Liver, spleen and kidneys non palpable. Musculoskeletal: Strenght 5/5 BUE/BLE. No difficulty with gait.  Neurological: Alert and oriented. Cranial nerves II-XII grossly intact. Coordination normal.  Psychiatric: Mood and affect normal. Behavior is normal. Judgment and thought content normal.    BMET    Component Value Date/Time   NA 138 09/24/2018 1545   K 3.9 09/24/2018 1545   CL 101 09/24/2018 1545   CO2 23  09/24/2018 1545   GLUCOSE 77 09/24/2018 1545   GLUCOSE 83 09/14/2016 1544   BUN 11 09/24/2018 1545   CREATININE 0.79 09/24/2018 1545   CREATININE 0.86 09/14/2016 1544   CALCIUM 9.0 09/24/2018 1545   GFRNONAA 91 09/24/2018 1545   GFRAA 105 09/24/2018 1545    Lipid Panel     Component Value Date/Time   CHOL 187 09/24/2018 1545   TRIG 272 (H) 09/24/2018 1545   HDL 38 (L) 09/24/2018 1545   CHOLHDL 4.9 (H) 09/24/2018 1545   CHOLHDL 6.3 (H) 09/14/2016 1544   VLDL 57 (H) 09/14/2016 1544   [place] 95 09/24/2018 1545    CBC    Component Value Date/Time   WBC 7.4 09/24/2018 1545   WBC 6.0 09/14/2016 1544   RBC 4.25 09/24/2018 1545   RBC 4.33 09/14/2016 1544  HGB 13.5 09/24/2018 1545   HGB 13.9 12/03/2014 1302   HCT 39.4 09/24/2018 1545   PLT 282 09/24/2018 1545   MCV 93 09/24/2018 1545   MCH 31.8 09/24/2018 1545   MCH 31.4 09/14/2016 1544   MCHC 34.3 09/24/2018 1545   MCHC 33.0 09/14/2016 1544   RDW 12.1 (L) 09/24/2018 1545   LYMPHSABS 1.0 04/17/2012 0745   MONOABS 0.3 04/17/2012 0745   EOSABS 0.0 04/17/2012 0745   BASOSABS 0.0 04/17/2012 0745    Hgb A1C Lab Results  Component Value Date   HGBA1C 5.5 09/24/2018           Assessment & Plan:   Preventative Health Maintenance:  Encouraged her to get a flu shot in the fall Tetanus UTD Pap smear UTD Mammogram UTD Referral to GI for screening colonoscopy Encouraged her to consume a balanced diet and exercise regimen Advised her to see an eye doctor and dentist annually Will check CBC, CMET, Lipid and A1C today  RTC in 1 year, sooner if needed Webb Silversmith, NP This visit occurred during the SARS-CoV-2 public health emergency.  Safety protocols were in place, including screening questions prior to the visit, additional usage of staff PPE, and extensive cleaning of exam room while observing appropriate contact time as indicated for disinfecting solutions.

## 2020-02-11 NOTE — Assessment & Plan Note (Signed)
Continue Amitriptyline °Will monitor °

## 2020-02-11 NOTE — Assessment & Plan Note (Signed)
Avoid foods that trigger your reflux RX for Omeprazole 20 mg daily CBC today

## 2020-02-11 NOTE — Assessment & Plan Note (Signed)
Will check A1C, if elevated can try Saxenda Encouraged low carb diet, exercise for weight loss

## 2020-02-12 LAB — LIPID PANEL
Cholesterol: 189 mg/dL (ref 0–200)
HDL: 40.5 mg/dL (ref >39.00)
LDL Cholesterol: 116 mg/dL — ABNORMAL HIGH (ref 0–99)
NonHDL: 148.01
Total CHOL/HDL Ratio: 5
Triglycerides: 160 mg/dL — ABNORMAL HIGH (ref 0.0–149.0)
VLDL: 32 mg/dL (ref 0.0–40.0)

## 2020-02-12 LAB — COMPREHENSIVE METABOLIC PANEL
ALT: 13 U/L (ref 0–35)
AST: 13 U/L (ref 0–37)
Albumin: 4.3 g/dL (ref 3.5–5.2)
Alkaline Phosphatase: 53 U/L (ref 39–117)
BUN: 17 mg/dL (ref 6–23)
CO2: 25 mEq/L (ref 19–32)
Calcium: 9.5 mg/dL (ref 8.4–10.5)
Chloride: 101 mEq/L (ref 96–112)
Creatinine, Ser: 0.85 mg/dL (ref 0.40–1.20)
GFR: 72.19 mL/min (ref >60.00)
Glucose, Bld: 84 mg/dL (ref 70–99)
Potassium: 4.1 mEq/L (ref 3.5–5.1)
Sodium: 134 mEq/L — ABNORMAL LOW (ref 135–145)
Total Bilirubin: 0.3 mg/dL (ref 0.2–1.2)
Total Protein: 7.4 g/dL (ref 6.0–8.3)

## 2020-02-12 LAB — CBC
HCT: 41.2 % (ref 36.0–46.0)
Hemoglobin: 14 g/dL (ref 12.0–15.0)
MCHC: 34 g/dL (ref 30.0–36.0)
MCV: 92.7 fl (ref 78.0–100.0)
Platelets: 285 10*3/uL (ref 150.0–400.0)
RBC: 4.44 Mil/uL (ref 3.87–5.11)
RDW: 13.7 % (ref 11.5–15.5)
WBC: 8.2 10*3/uL (ref 4.0–10.5)

## 2020-02-12 LAB — HEMOGLOBIN A1C: Hgb A1c MFr Bld: 5.8 % (ref 4.6–6.5)

## 2020-02-12 MED ORDER — SAXENDA 18 MG/3ML ~~LOC~~ SOPN: 0.6000 mg | PEN_INJECTOR | Freq: Every day | SUBCUTANEOUS | 0 refills | Status: DC

## 2020-02-12 NOTE — Addendum Note (Signed)
Addended by: Jearld Fenton on: 02/12/2020 11:15 AM   Modules accepted: Orders

## 2020-02-23 ENCOUNTER — Telehealth: Payer: Self-pay | Admitting: Internal Medicine

## 2020-02-23 NOTE — Telephone Encounter (Signed)
She needs to make an appt to discuss. At her last visit, she said she wasn't having much back pain. There are no xrays on file. We need to get those done before we refer.

## 2020-02-23 NOTE — Telephone Encounter (Signed)
Patient called today in regards to back pain She stated when he was in a few weeks ago she discussed having back pain. Patient said it is not getting any better and would like to know if there is somewhere that you could refer her to for the pain   Please advise

## 2020-02-24 NOTE — Telephone Encounter (Signed)
Left detailed msg on VM per HIPAA  

## 2020-02-24 NOTE — Telephone Encounter (Signed)
Pt called in today and said she was under the impression that Rollene Fare would call something in. If she needs an appointment then she will call the chiropractor. She said she never received a call from yesterday.

## 2020-02-27 ENCOUNTER — Ambulatory Visit (INDEPENDENT_AMBULATORY_CARE_PROVIDER_SITE_OTHER): Payer: BC Managed Care – PPO | Admitting: Internal Medicine

## 2020-02-27 ENCOUNTER — Other Ambulatory Visit: Payer: Self-pay

## 2020-02-27 VITALS — BP 126/82 | HR 82 | Wt 166.0 lb

## 2020-02-27 DIAGNOSIS — G8929 Other chronic pain: Secondary | ICD-10-CM

## 2020-02-27 DIAGNOSIS — M549 Dorsalgia, unspecified: Secondary | ICD-10-CM

## 2020-02-27 MED ORDER — KETOROLAC TROMETHAMINE 30 MG/ML IJ SOLN
30.0000 mg | Freq: Once | INTRAMUSCULAR | Status: AC
  Administered 2020-02-27: 30 mg via INTRAMUSCULAR

## 2020-02-27 MED ORDER — CYCLOBENZAPRINE HCL 10 MG PO TABS: 10.0000 mg | ORAL_TABLET | Freq: Three times a day (TID) | ORAL | 0 refills | Status: DC | PRN

## 2020-02-27 NOTE — Progress Notes (Signed)
Subjective:    Patient ID: Phyllis Morales, female    DOB: September 23, 1976, 44 y.o.   MRN: [id number]  HPI  Pt presents to the clinic today with c/o back pain. This started 2 weeks ago. She describes the pain as sharp with muscle tightness. The pain radiates into her upper back and into her left hip. She denies numbness, tingling or weakness of the lower extremity. She denies issues with bowel or bladder control. She denies any specific injury's. She has used Ibuprofen and a heating pad with minimal relief.   Review of Systems      Past Medical History:  Diagnosis Date  . Abnormal Pap smear 2004   Hx of abnormal pap smear   . Headache(784.0) 04/17/12   "maybe once/wk"  . Migraines   . Sleep disturbance, unspecified    "just not sleeping well sometimes"    Current Outpatient Medications  Medication Sig Dispense Refill  . amitriptyline (ELAVIL) 50 MG tablet Take 1 tablet (50 mg total) by mouth at bedtime. 90 tablet 0  . ibuprofen (ADVIL) 800 MG tablet Take 1 tablet (800 mg total) by mouth daily as needed. 90 tablet 0  . Levonorgestrel-Ethinyl Estradiol (AMETHIA) 0.15-0.03 &0.01 MG tablet Take 1 tablet by mouth daily. 91 tablet 4  . Liraglutide -Weight Management (SAXENDA) 18 MG/3ML SOPN Inject 0.1 mLs (0.6 mg total) into the skin daily. 1 pen 0  . omeprazole (PRILOSEC) 20 MG capsule Take 1 capsule (20 mg total) by mouth daily. 90 capsule 3   No current facility-administered medications for this visit.    Allergies  Allergen Reactions  . Topamax [Topiramate] Other (See Comments)    Numbness in hands and feet    Family History  Problem Relation Age of Onset  . Hyperlipidemia Mother   . Arthritis Mother   . Stroke Father     Social History   Socioeconomic History  . Marital status: Married    Spouse name: Not on file  . Number of children: Not on file  . Years of education: Not on file  . Highest education level: Not on file  Occupational History  . Not on file    Tobacco Use  . Smoking status: Never Smoker  . Smokeless tobacco: Never Used  Substance and Sexual Activity  . Alcohol use: Yes    Comment: 2-3 a month  . Drug use: No  . Sexual activity: Yes    Partners: Male    Birth control/protection: Pill  Other Topics Concern  . Not on file  Social History Narrative  . Not on file   Social Determinants of Health   Financial Resource Strain:   . Difficulty of Paying Living Expenses:   Food Insecurity:   . Worried About Charity fundraiser in the Last Year:   . Arboriculturist in the Last Year:   Transportation Needs:   . Film/video editor (Medical):   Marland Kitchen Lack of Transportation (Non-Medical):   Physical Activity:   . Days of Exercise per Week:   . Minutes of Exercise per Session:   Stress:   . Feeling of Stress :   Social Connections:   . Frequency of Communication with Friends and Family:   . Frequency of Social Gatherings with Friends and Family:   . Attends Religious Services:   . Active Member of Clubs or Organizations:   . Attends Archivist Meetings:   Marland Kitchen Marital Status:   Intimate Partner Violence:   .  Fear of Current or Ex-Partner:   . Emotionally Abused:   Marland Kitchen Physically Abused:   . Sexually Abused:      Constitutional: Denies fever, malaise, fatigue, headache or abrupt weight changes.  Respiratory: Denies difficulty breathing, shortness of breath, cough or sputum production.   Cardiovascular: Denies chest pain, chest tightness, palpitations or swelling in the hands or feet.  Gastrointestinal: Denies abdominal pain, bloating, constipation, diarrhea or blood in the stool.  GU: Denies urgency, frequency, pain with urination, burning sensation, blood in urine, odor or discharge. Musculoskeletal: Pt reports back pain. Denies decrease in range of motion, difficulty with gait, or joint swelling.  Skin: Denies redness, rashes, lesions or ulcercations.  Neurological: Denies numbness, tingling, weakness or problems  with balance and coordination.    No other specific complaints in a complete review of systems (except as listed in HPI above).  Objective:   Physical Exam   BP 126/82   Pulse 82   Wt 166 lb (75.3 kg)   SpO2 98%   BMI 32.97 kg/m   Wt Readings from Last 3 Encounters:  02/11/20 170 lb (77.1 kg)  09/26/19 168 lb (76.2 kg)  12/24/18 174 lb (78.9 kg)    General: Appears her stated age, well developed, well nourished in NAD. Skin: Warm, dry and intact. No rashes noted. Cardiovascular: Normal rate and rhythm. S1,S2 noted.  No murmur, rubs or gallops noted. Pulmonary/Chest: Normal effort and positive vesicular breath sounds. No respiratory distress. No wheezes, rales or ronchi noted.  Abdomen: No CVA tenderness noted. Musculoskeletal: Pain with rotation and lateral bending to the left. Normal rotation, lateral bending, flexion and extension of the spine. No bony tenderness noted of the left paralumbar and left subscapular areas. Strength 5/5 BLE. Able to stand on heels and toes. Strength 5/5 BLE. Neurological: Alert and oriented.    BMET    Component Value Date/Time   NA 134 (L) 02/11/2020 1508   NA 138 09/24/2018 1545   K 4.1 02/11/2020 1508   CL 101 02/11/2020 1508   CO2 25 02/11/2020 1508   GLUCOSE 84 02/11/2020 1508   BUN 17 02/11/2020 1508   BUN 11 09/24/2018 1545   CREATININE 0.85 02/11/2020 1508   CREATININE 0.86 09/14/2016 1544   CALCIUM 9.5 02/11/2020 1508   GFRNONAA 91 09/24/2018 1545   GFRAA 105 09/24/2018 1545    Lipid Panel     Component Value Date/Time   CHOL 189 02/11/2020 1508   CHOL 187 09/24/2018 1545   TRIG 160.0 (H) 02/11/2020 1508   HDL 40.50 02/11/2020 1508   HDL 38 (L) 09/24/2018 1545   CHOLHDL 5 02/11/2020 1508   VLDL 32.0 02/11/2020 1508   LDLCALC 116 (H) 02/11/2020 1508   LDLCALC 95 09/24/2018 1545    CBC    Component Value Date/Time   WBC 8.2 02/11/2020 1508   RBC 4.44 02/11/2020 1508   HGB 14.0 02/11/2020 1508   HGB 13.5  09/24/2018 1545   HGB 13.9 12/03/2014 1302   HCT 41.2 02/11/2020 1508   HCT 39.4 09/24/2018 1545   PLT 285.0 02/11/2020 1508   PLT 282 09/24/2018 1545   MCV 92.7 02/11/2020 1508   MCV 93 09/24/2018 1545   MCH 31.8 09/24/2018 1545   MCH 31.4 09/14/2016 1544   MCHC 34.0 02/11/2020 1508   RDW 13.7 02/11/2020 1508   RDW 12.1 (L) 09/24/2018 1545   LYMPHSABS 1.0 04/17/2012 0745   MONOABS 0.3 04/17/2012 0745   EOSABS 0.0 04/17/2012 0745   BASOSABS  0.0 04/17/2012 0745    Hgb A1C Lab Results  Component Value Date   HGBA1C 5.8 02/11/2020             Assessment & Plan:    Webb Silversmith, NP This visit occurred during the SARS-CoV-2 public health emergency.  Safety protocols were in place, including screening questions prior to the visit, additional usage of staff PPE, and extensive cleaning of exam room while observing appropriate contact time as indicated for disinfecting solutions.

## 2020-02-29 ENCOUNTER — Encounter: Payer: Self-pay | Admitting: Internal Medicine

## 2020-02-29 NOTE — Patient Instructions (Signed)

## 2020-02-29 NOTE — Assessment & Plan Note (Addendum)
Flare Toradol 30 mg IM today RX for Flexeril 10 mg TID prn- sedation caution given Continue heating pad Encouraged stretching If worse, will have her come in for thoracic, lumbar xray

## 2020-03-15 ENCOUNTER — Other Ambulatory Visit: Payer: Self-pay | Admitting: Internal Medicine

## 2020-03-26 ENCOUNTER — Encounter: Payer: Self-pay | Admitting: Internal Medicine

## 2020-04-21 ENCOUNTER — Other Ambulatory Visit: Payer: Self-pay | Admitting: Internal Medicine

## 2020-05-04 DIAGNOSIS — F4322 Adjustment disorder with anxiety: Secondary | ICD-10-CM | POA: Diagnosis not present

## 2020-05-04 DIAGNOSIS — F900 Attention-deficit hyperactivity disorder, predominantly inattentive type: Secondary | ICD-10-CM | POA: Diagnosis not present

## 2020-08-30 ENCOUNTER — Other Ambulatory Visit: Payer: Self-pay | Admitting: Internal Medicine

## 2020-09-27 ENCOUNTER — Ambulatory Visit: Payer: BC Managed Care – PPO | Admitting: Certified Nurse Midwife

## 2020-09-29 ENCOUNTER — Other Ambulatory Visit: Payer: Self-pay | Admitting: Internal Medicine

## 2020-10-11 ENCOUNTER — Other Ambulatory Visit: Payer: Self-pay | Admitting: Internal Medicine

## 2020-10-11 ENCOUNTER — Other Ambulatory Visit: Payer: Self-pay | Admitting: *Deleted

## 2020-10-11 NOTE — Telephone Encounter (Signed)
Patient left a voicemail stating that she has been trying to get a refill on her migraine medication since 09/29/20. Patient stated that she really needs something stronger. Patient stated that she has now had a headache for two days. Looks like it was refilled.

## 2020-10-11 NOTE — Telephone Encounter (Signed)
If she needs something stronger, she needs to make an appt with me to discuss

## 2020-10-12 NOTE — Telephone Encounter (Signed)
Phyllis Morales left message on triage stating she called yesterday but never got a return call from the nurse.  She is asking that someone call her back today.  [phone] or [phone].

## 2020-10-13 NOTE — Telephone Encounter (Signed)
Left detailed msg on VM per HIPAA  

## 2020-10-13 NOTE — Telephone Encounter (Signed)
Contacted [facility] and spoke to pharmacist, Museum/gallery conservator, who reports their system was down for the entire week of when the script was sent in on 11/17 and they did not receive it. Gave a verbal order for refill of amitriptyline. Amber readback order for verification.   Left a detailed msg that script was called in.

## 2020-10-13 NOTE — Telephone Encounter (Signed)
Patient called in asking to speak with manager. Stated unavailable and assisted with situation. Patient called in stating she is needing to have her script called in for her migraines, as she stated they have been waiting since 09/29/20. Patient has been having a significant migraine for 3+ days. Notified patient I will reach out to management and see if we can resend script, as it was confirmed on our end. Also scheduled patient virtual visit with provider for tomorrow 10/14/20, as she is requesting a stronger dose and that is needed per note. Pt expressed understanding and thanks and would like a call when done, or detailed voicemail to either numbers listed. Please advise.

## 2020-10-14 ENCOUNTER — Other Ambulatory Visit: Payer: Self-pay

## 2020-10-14 ENCOUNTER — Encounter: Payer: Self-pay | Admitting: Internal Medicine

## 2020-10-14 ENCOUNTER — Telehealth (INDEPENDENT_AMBULATORY_CARE_PROVIDER_SITE_OTHER): Payer: BC Managed Care – PPO | Admitting: Internal Medicine

## 2020-10-14 DIAGNOSIS — R519 Headache, unspecified: Secondary | ICD-10-CM | POA: Diagnosis not present

## 2020-10-14 MED ORDER — SUMATRIPTAN SUCCINATE 25 MG PO TABS: 25.0000 mg | ORAL_TABLET | ORAL | 2 refills | Status: DC | PRN

## 2020-10-14 NOTE — Progress Notes (Signed)
Subjective:    Patient ID: Phyllis Morales, female    DOB: 03/25/1976, 44 y.o.   MRN: [id number]  HPI  Pt presents to the clinic today to discuss her migraines. These occur about 4 times per week. She thinks they may be triggered by stress. The headaches are located in the back of her head. She describes the pain as sharp, pounding and throbbing. She has had some nausea but denies dizziness, chronic neck pain, sensitivity to light, sound or vomiting. She is currently managed on Amitriptyline but has been out for 2 weeks. She takes Ibuprofen as needed for breakthrough but reports it has not been as effective.  Review of Systems      Past Medical History:  Diagnosis Date  . Abnormal Pap smear 2004   Hx of abnormal pap smear   . Headache(784.0) 04/17/12   "maybe once/wk"  . Migraines   . Sleep disturbance, unspecified    "just not sleeping well sometimes"    Current Outpatient Medications  Medication Sig Dispense Refill  . amitriptyline (ELAVIL) 50 MG tablet TAKE 1 TABLET(50 MG) BY MOUTH AT BEDTIME 90 tablet 0  . cyclobenzaprine (FLEXERIL) 10 MG tablet Take 1 tablet (10 mg total) by mouth 3 (three) times daily as needed for muscle spasms. 20 tablet 0  . ibuprofen (ADVIL) 800 MG tablet TAKE 1 TABLET(800 MG) BY MOUTH DAILY AS NEEDED 90 tablet 0  . Levonorgestrel-Ethinyl Estradiol (AMETHIA) 0.15-0.03 &0.01 MG tablet Take 1 tablet by mouth daily. 91 tablet 4  . Liraglutide -Weight Management (SAXENDA) 18 MG/3ML SOPN Inject 0.1 mLs (0.6 mg total) into the skin daily. 1 pen 0  . omeprazole (PRILOSEC) 20 MG capsule Take 1 capsule (20 mg total) by mouth daily. 90 capsule 3   No current facility-administered medications for this visit.    Allergies  Allergen Reactions  . Topamax [Topiramate] Other (See Comments)    Numbness in hands and feet    Family History  Problem Relation Age of Onset  . Hyperlipidemia Mother   . Arthritis Mother   . Stroke Father     Social History    Socioeconomic History  . Marital status: Married    Spouse name: Not on file  . Number of children: Not on file  . Years of education: Not on file  . Highest education level: Not on file  Occupational History  . Not on file  Tobacco Use  . Smoking status: Never Smoker  . Smokeless tobacco: Never Used  Substance and Sexual Activity  . Alcohol use: Yes    Comment: 2-3 a month  . Drug use: No  . Sexual activity: Yes    Partners: Male    Birth control/protection: Pill  Other Topics Concern  . Not on file  Social History Narrative  . Not on file   Social Determinants of Health   Financial Resource Strain:   . Difficulty of Paying Living Expenses: Not on file  Food Insecurity:   . Worried About Charity fundraiser in the Last Year: Not on file  . Ran Out of Food in the Last Year: Not on file  Transportation Needs:   . Lack of Transportation (Medical): Not on file  . Lack of Transportation (Non-Medical): Not on file  Physical Activity:   . Days of Exercise per Week: Not on file  . Minutes of Exercise per Session: Not on file  Stress:   . Feeling of Stress : Not on file  Social Connections:   .  Frequency of Communication with Friends and Family: Not on file  . Frequency of Social Gatherings with Friends and Family: Not on file  . Attends Religious Services: Not on file  . Active Member of Clubs or Organizations: Not on file  . Attends Archivist Meetings: Not on file  . Marital Status: Not on file  Intimate Partner Violence:   . Fear of Current or Ex-Partner: Not on file  . Emotionally Abused: Not on file  . Physically Abused: Not on file  . Sexually Abused: Not on file     Constitutional: Pt reports headaches. Denies fever, malaise, fatigue, or abrupt weight changes.  HEENT: Denies eye pain, eye redness, ear pain, ringing in the ears, wax buildup, runny nose, nasal congestion, bloody nose, or sore throat. Respiratory: Denies difficulty breathing,  shortness of breath, cough or sputum production.   Cardiovascular: Denies chest pain, chest tightness, palpitations or swelling in the hands or feet.  Gastrointestinal: Pt reports intermittent nausea. Denies abdominal pain, bloating, constipation, diarrhea or blood in the stool.  Neurological: Denies dizziness, difficulty with memory, difficulty with speech or problems with balance and coordination.    No other specific complaints in a complete review of systems (except as listed in HPI above).  Objective:   Physical Exam    Wt Readings from Last 3 Encounters:  02/27/20 166 lb (75.3 kg)  02/11/20 170 lb (77.1 kg)  09/26/19 168 lb (76.2 kg)    General: Appears her stated age, in NAD. Pulmonary/Chest: Normal effort. Neurological: Alert and oriented.    BMET    Component Value Date/Time   NA 134 (L) 02/11/2020 1508   NA 138 09/24/2018 1545   K 4.1 02/11/2020 1508   CL 101 02/11/2020 1508   CO2 25 02/11/2020 1508   GLUCOSE 84 02/11/2020 1508   BUN 17 02/11/2020 1508   BUN 11 09/24/2018 1545   CREATININE 0.85 02/11/2020 1508   CREATININE 0.86 09/14/2016 1544   CALCIUM 9.5 02/11/2020 1508   GFRNONAA 91 09/24/2018 1545   GFRAA 105 09/24/2018 1545    Lipid Panel     Component Value Date/Time   CHOL 189 02/11/2020 1508   CHOL 187 09/24/2018 1545   TRIG 160.0 (H) 02/11/2020 1508   HDL 40.50 02/11/2020 1508   HDL 38 (L) 09/24/2018 1545   CHOLHDL 5 02/11/2020 1508   VLDL 32.0 02/11/2020 1508   LDLCALC 116 (H) 02/11/2020 1508   LDLCALC 95 09/24/2018 1545    CBC    Component Value Date/Time   WBC 8.2 02/11/2020 1508   RBC 4.44 02/11/2020 1508   HGB 14.0 02/11/2020 1508   HGB 13.5 09/24/2018 1545   HGB 13.9 12/03/2014 1302   HCT 41.2 02/11/2020 1508   HCT 39.4 09/24/2018 1545   PLT 285.0 02/11/2020 1508   PLT 282 09/24/2018 1545   MCV 92.7 02/11/2020 1508   MCV 93 09/24/2018 1545   MCH 31.8 09/24/2018 1545   MCH 31.4 09/14/2016 1544   MCHC 34.0 02/11/2020 1508    RDW 13.7 02/11/2020 1508   RDW 12.1 (L) 09/24/2018 1545   LYMPHSABS 1.0 04/17/2012 0745   MONOABS 0.3 04/17/2012 0745   EOSABS 0.0 04/17/2012 0745   BASOSABS 0.0 04/17/2012 0745    Hgb A1C Lab Results  Component Value Date   HGBA1C 5.8 02/11/2020            Assessment & Plan:    Webb Silversmith, NP This visit occurred during the SARS-CoV-2 public health emergency.  Safety protocols were in place, including screening questions prior to the visit, additional usage of staff PPE, and extensive cleaning of exam room while observing appropriate contact time as indicated for disinfecting solutions.

## 2020-10-14 NOTE — Patient Instructions (Signed)

## 2020-10-14 NOTE — Assessment & Plan Note (Signed)
Deteriorated Continue Amitriptyline, refilled yesterday RX for Imitrex 25 mg daily prn

## 2020-10-28 DIAGNOSIS — F4321 Adjustment disorder with depressed mood: Secondary | ICD-10-CM | POA: Diagnosis not present

## 2020-12-27 ENCOUNTER — Other Ambulatory Visit: Payer: Self-pay | Admitting: Nurse Practitioner

## 2020-12-27 ENCOUNTER — Telehealth: Payer: Self-pay | Admitting: *Deleted

## 2020-12-27 DIAGNOSIS — R300 Dysuria: Secondary | ICD-10-CM

## 2020-12-27 MED ORDER — SULFAMETHOXAZOLE-TRIMETHOPRIM 800-160 MG PO TABS: 1.0000 | ORAL_TABLET | Freq: Two times a day (BID) | ORAL | 0 refills | Status: DC

## 2020-12-27 NOTE — Progress Notes (Signed)
Reports symptoms started last night, got up 8 times to urinate. Today had burning with urination. States no vaginal symptoms. Has never had a bladder infection in past. Rx sent per patient request.  Pt to follow up for annual exam. Karma Ganja. WHNP

## 2020-12-27 NOTE — Telephone Encounter (Signed)
Patient called c/o frequent urination, burning with urination, reports symptoms started last night. Using otc AZO and drinking cranberry juice all day today. I explained office visit is recommended for treatment, patient said she has no money for a co-pay right now. She is over due for annual exam as well, I recommended she schedule this as well. Patient agreed she would. She asked if you would be willing to prescribe Rx?

## 2020-12-27 NOTE — Telephone Encounter (Signed)
Although not my usual practice because I think it is very important to verify infection prior to treating, but considering her circumstance, I will send Rx this time. Please help her schedule annual since preventive visits are usually covered well by insurance.

## 2020-12-28 NOTE — Telephone Encounter (Signed)
Phyllis Morales told me she spoke with patient yesterday evening.

## 2021-01-14 NOTE — Progress Notes (Signed)
45 y.o. G35P1001 Married White or Caucasian female here for annual exam.    Called 12/27/2020 with bladder infection symptoms, treated with Bactrim, started feeling some pressure 2 days ago.  Patient's last menstrual period was 12/05/2020.          Sexually active: Yes.    The current method of family planning is OCP (estrogen/progesterone).    Exercising: Yes.    walking Smoker:  no  Health Maintenance: Pap:  09-18-17 neg HPV HR neg, 09-26-2019 neg History of abnormal Pap:  Yes LEEP 2004 MMG:  10-28-2019 category a density birads 2:neg Colonoscopy:  none BMD:   none TDaP:  2014 Gardasil:   n/a Covid-19: pfizer Hep C testing: not done Screening Labs: Will draw today (states does not see PCP much)   reports that she has never smoked. She has never used smokeless tobacco. She reports current alcohol use. She reports that she does not use drugs.  Past Medical History:  Diagnosis Date  . Abnormal Pap smear 2004   Hx of abnormal pap smear   . Headache(784.0) 04/17/12   "maybe once/wk"  . Migraines   . Sleep disturbance, unspecified    "just not sleeping well sometimes"    Past Surgical History:  Procedure Laterality Date  . BREAST REDUCTION SURGERY  04/2019  . 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 CHOLECYSTECTOMY  04/17/2012   Procedure: LAPAROSCOPIC CHOLECYSTECTOMY WITH INTRAOPERATIVE CHOLANGIOGRAM;  Surgeon: Adin Hector, MD;  Location: [facility];  Service: General;  Laterality: N/A;  . DILATION AND CURETTAGE OF UTERUS  ~ 2002  . LIPOSUCTION      Current Outpatient Medications  Medication Sig Dispense Refill  . amitriptyline (ELAVIL) 50 MG tablet TAKE 1 TABLET BY MOUTH EVERY NIGHT AT BEDTIME 90 tablet 0  . amphetamine-dextroamphetamine (ADDERALL) 20 MG tablet Take 20 mg by mouth 3 (three) times daily.    Marland Kitchen ibuprofen (ADVIL) 800 MG tablet TAKE 1 TABLET(800 MG) BY MOUTH DAILY AS NEEDED 90 tablet 0  . Levonorgestrel-Ethinyl  Estradiol (AMETHIA) 0.15-0.03 &0.01 MG tablet Take 1 tablet by mouth daily. 91 tablet 4  . traZODone (DESYREL) 50 MG tablet Take 50-150 mg by mouth at bedtime.     No current facility-administered medications for this visit.    Family History  Problem Relation Age of Onset  . Hyperlipidemia Mother   . Arthritis Mother   . Stroke Father     Review of Systems  Constitutional: Negative.   HENT: Negative.   Eyes: Negative.   Respiratory: Negative.   Cardiovascular: Negative.   Gastrointestinal: Negative.   Endocrine: Negative.   Genitourinary: Negative.   Musculoskeletal: Negative.   Skin: Negative.   Allergic/Immunologic: Negative.   Neurological: Negative.   Hematological: Negative.   Psychiatric/Behavioral: Negative.     Exam:   BP 118/68   Pulse 79   Resp 16   Ht 4' 11.5" (1.511 m)   Wt 155 lb (70.3 kg)   LMP 12/05/2020   BMI 30.78 kg/m   Height: 4' 11.5" (151.1 cm)  General appearance: alert, cooperative and appears stated age, no acute distress Head: Normocephalic, without obvious abnormality Neck: no adenopathy, thyroid normal to inspection and palpation Lungs: clear to auscultation bilaterally Breasts: No axillary or supraclavicular adenopathy, Normal to palpation without dominant masses, breast reduction surgery Heart: regular rate and rhythm Abdomen: soft, non-tender; no masses,  no organomegaly Extremities: extremities normal, no edema Skin: No  rashes or lesions Lymph nodes: Cervical, supraclavicular, and axillary nodes normal. No abnormal inguinal nodes palpated Neurologic: Grossly normal   Pelvic: External genitalia:  no lesions              Urethra:  normal appearing urethra with no masses, tenderness or lesions              Bartholins and Skenes: normal                 Vagina: normal appearing vagina, appropriate for age, normal appearing discharge, no lesions              Cervix: neg cervical motion tenderness, no visible lesions              Bimanual Exam:   Uterus:  normal size, contour, position, consistency, mobility, non-tender              Adnexa: no mass, fullness, tenderness                 Joy, CMA Chaperone was present for exam.  A:  Well woman exam with routine gynecological exam  Laboratory exam ordered as part of routine general medical examination - Plan: Urinalysis,Complete w/RFL Culture  Encounter for surveillance of contraceptive pills - Plan: Levonorgestrel-Ethinyl Estradiol (AMETHIA) 0.15-0.03 &0.01 MG tablet  General counseling and advice on contraceptive management - Plan: IUD Insertion  Screening for lipid disorders - Plan: Lipid Profile  Screening for diabetes mellitus - Plan: Comp Met (CMET)  Cystitis - Plan: nitrofurantoin, macrocrystal-monohydrate, (MACROBID) 100 MG capsule   P:   Pap : due 2023, Hx LEEP 2004  Mammogram:Pt to schedule, gets done at [facility], phone number provided  Advised with elevated Cholesterol/age,  Consider Mirena IUD as safer alternative. Will plan insertion with next menses. Continue OCP until ready for insertion  Will offer same day collection of GC/CT with IUD insertion (pt wasn't sure if covered by insurance, will check)

## 2021-01-17 ENCOUNTER — Other Ambulatory Visit: Payer: Self-pay | Admitting: Internal Medicine

## 2021-01-18 ENCOUNTER — Other Ambulatory Visit: Payer: Self-pay

## 2021-01-18 ENCOUNTER — Encounter: Payer: Self-pay | Admitting: Nurse Practitioner

## 2021-01-18 ENCOUNTER — Ambulatory Visit (INDEPENDENT_AMBULATORY_CARE_PROVIDER_SITE_OTHER): Payer: BC Managed Care – PPO | Admitting: Nurse Practitioner

## 2021-01-18 VITALS — BP 118/68 | HR 79 | Resp 16 | Ht 59.5 in | Wt 155.0 lb

## 2021-01-18 DIAGNOSIS — Z13220 Encounter for screening for lipoid disorders: Secondary | ICD-10-CM

## 2021-01-18 DIAGNOSIS — Z131 Encounter for screening for diabetes mellitus: Secondary | ICD-10-CM

## 2021-01-18 DIAGNOSIS — Z0000 Encounter for general adult medical examination without abnormal findings: Secondary | ICD-10-CM

## 2021-01-18 DIAGNOSIS — Z3009 Encounter for other general counseling and advice on contraception: Secondary | ICD-10-CM | POA: Diagnosis not present

## 2021-01-18 DIAGNOSIS — Z3041 Encounter for surveillance of contraceptive pills: Secondary | ICD-10-CM | POA: Diagnosis not present

## 2021-01-18 DIAGNOSIS — N3090 Cystitis, unspecified without hematuria: Secondary | ICD-10-CM | POA: Diagnosis not present

## 2021-01-18 DIAGNOSIS — Z01419 Encounter for gynecological examination (general) (routine) without abnormal findings: Secondary | ICD-10-CM

## 2021-01-18 MED ORDER — LEVONORGEST-ETH ESTRAD 91-DAY 0.15-0.03 &0.01 MG PO TABS: 1.0000 | ORAL_TABLET | Freq: Every day | ORAL | 4 refills | Status: DC

## 2021-01-18 MED ORDER — NITROFURANTOIN MONOHYD MACRO 100 MG PO CAPS: 100.0000 mg | ORAL_CAPSULE | Freq: Two times a day (BID) | ORAL | 0 refills | Status: DC

## 2021-01-18 NOTE — Patient Instructions (Signed)
Health Maintenance, Female Adopting a healthy lifestyle and getting preventive care are important in promoting health and wellness. Ask your health care provider about:  The right schedule for you to have regular tests and exams.  Things you can do on your own to prevent diseases and keep yourself healthy. What should I know about diet, weight, and exercise? Eat a healthy diet  Eat a diet that includes plenty of vegetables, fruits, low-fat dairy products, and lean protein.  Do not eat a lot of foods that are high in solid fats, added sugars, or sodium.   Maintain a healthy weight Body mass index (BMI) is used to identify weight problems. It estimates body fat based on height and weight. Your health care provider can help determine your BMI and help you achieve or maintain a healthy weight. Get regular exercise Get regular exercise. This is one of the most important things you can do for your health. Most adults should:  Exercise for at least 150 minutes each week. The exercise should increase your heart rate and make you sweat (moderate-intensity exercise).  Do strengthening exercises at least twice a week. This is in addition to the moderate-intensity exercise.  Spend less time sitting. Even light physical activity can be beneficial. Watch cholesterol and blood lipids Have your blood tested for lipids and cholesterol at 47 years of age, then have this test every 5 years. Have your cholesterol levels checked more often if:  Your lipid or cholesterol levels are high.  You are older than 47 years of age.  You are at high risk for heart disease. What should I know about cancer screening? Depending on your health history and family history, you may need to have cancer screening at various ages. This may include screening for:  Breast cancer.  Cervical cancer.  Colorectal cancer.  Skin cancer.  Lung cancer. What should I know about heart disease, diabetes, and high blood  pressure? Blood pressure and heart disease  High blood pressure causes heart disease and increases the risk of stroke. This is more likely to develop in people who have high blood pressure readings, are of African descent, or are overweight.  Have your blood pressure checked: ? Every 3-5 years if you are 18-39 years of age. ? Every year if you are 40 years old or older. Diabetes Have regular diabetes screenings. This checks your fasting blood sugar level. Have the screening done:  Once every three years after age 40 if you are at a normal weight and have a low risk for diabetes.  More often and at a younger age if you are overweight or have a high risk for diabetes. What should I know about preventing infection? Hepatitis B If you have a higher risk for hepatitis B, you should be screened for this virus. Talk with your health care provider to find out if you are at risk for hepatitis B infection. Hepatitis C Testing is recommended for:  Everyone born from 1945 through 1965.  Anyone with known risk factors for hepatitis C. Sexually transmitted infections (STIs)  Get screened for STIs, including gonorrhea and chlamydia, if: ? You are sexually active and are younger than 47 years of age. ? You are older than 47 years of age and your health care provider tells you that you are at risk for this type of infection. ? Your sexual activity has changed since you were last screened, and you are at increased risk for chlamydia or gonorrhea. Ask your health care provider   if you are at risk.  Ask your health care provider about whether you are at high risk for HIV. Your health care provider may recommend a prescription medicine to help prevent HIV infection. If you choose to take medicine to prevent HIV, you should first get tested for HIV. You should then be tested every 3 months for as long as you are taking the medicine. Pregnancy  If you are about to stop having your period (premenopausal) and  you may become pregnant, seek counseling before you get pregnant.  Take 400 to 800 micrograms (mcg) of folic acid every day if you become pregnant.  Ask for birth control (contraception) if you want to prevent pregnancy. Osteoporosis and menopause Osteoporosis is a disease in which the bones lose minerals and strength with aging. This can result in bone fractures. If you are 65 years old or older, or if you are at risk for osteoporosis and fractures, ask your health care provider if you should:  Be screened for bone loss.  Take a calcium or vitamin D supplement to lower your risk of fractures.  Be given hormone replacement therapy (HRT) to treat symptoms of menopause. Follow these instructions at home: Lifestyle  Do not use any products that contain nicotine or tobacco, such as cigarettes, e-cigarettes, and chewing tobacco. If you need help quitting, ask your health care provider.  Do not use street drugs.  Do not share needles.  Ask your health care provider for help if you need support or information about quitting drugs. Alcohol use  Do not drink alcohol if: ? Your health care provider tells you not to drink. ? You are pregnant, may be pregnant, or are planning to become pregnant.  If you drink alcohol: ? Limit how much you use to 0-1 drink a day. ? Limit intake if you are breastfeeding.  Be aware of how much alcohol is in your drink. In the [place] one drink equals one 12 oz bottle of beer (355 mL), one 5 oz glass of wine (148 mL), or one 1 oz glass of hard liquor (44 mL). General instructions  Schedule regular health, dental, and eye exams.  Stay current with your vaccines.  Tell your health care provider if: ? You often feel depressed. ? You have ever been abused or do not feel safe at home. Summary  Adopting a healthy lifestyle and getting preventive care are important in promoting health and wellness.  Follow your health care provider's instructions about healthy  diet, exercising, and getting tested or screened for diseases.  Follow your health care provider's instructions on monitoring your cholesterol and blood pressure. This information is not intended to replace advice given to you by your health care provider. Make sure you discuss any questions you have with your health care provider. Document Revised: 10/23/2018 Document Reviewed: 10/23/2018 Elsevier Patient Education  2021 Elsevier Inc.  

## 2021-01-19 LAB — COMPREHENSIVE METABOLIC PANEL
AG Ratio: 1.7 (calc) (ref 1.0–2.5)
ALT: 11 U/L (ref 6–29)
AST: 11 U/L (ref 10–35)
Albumin: 4 g/dL (ref 3.6–5.1)
Alkaline phosphatase (APISO): 47 U/L (ref 31–125)
BUN: 9 mg/dL (ref 7–25)
CO2: 25 mmol/L (ref 20–32)
Calcium: 8.7 mg/dL (ref 8.6–10.2)
Chloride: 105 mmol/L (ref 98–110)
Creat: 0.86 mg/dL (ref 0.50–1.10)
Globulin: 2.4 g/dL (calc) (ref 1.9–3.7)
Glucose, Bld: 73 mg/dL (ref 65–99)
Potassium: 4 mmol/L (ref 3.5–5.3)
Sodium: 137 mmol/L (ref 135–146)
Total Bilirubin: 0.2 mg/dL (ref 0.2–1.2)
Total Protein: 6.4 g/dL (ref 6.1–8.1)

## 2021-01-19 LAB — LIPID PANEL
Cholesterol: 203 mg/dL — ABNORMAL HIGH (ref <200)
HDL: 48 mg/dL — ABNORMAL LOW (ref >=50)
LDL Cholesterol (Calc): 134 mg/dL (calc) — ABNORMAL HIGH
Non-HDL Cholesterol (Calc): 155 mg/dL (calc) — ABNORMAL HIGH (ref <130)
Total CHOL/HDL Ratio: 4.2 (calc) (ref <5.0)
Triglycerides: 104 mg/dL (ref <150)

## 2021-01-21 LAB — URINALYSIS, COMPLETE W/RFL CULTURE
Bilirubin Urine: NEGATIVE
Glucose, UA: NEGATIVE
Hyaline Cast: NONE SEEN /LPF
Ketones, ur: NEGATIVE
Nitrites, Initial: NEGATIVE
Protein, ur: NEGATIVE
Specific Gravity, Urine: 1.025 (ref 1.001–1.03)
pH: 6 (ref 5.0–8.0)

## 2021-01-21 LAB — CULTURE INDICATED

## 2021-01-21 LAB — URINE CULTURE
MICRO NUMBER:: 11621117
SPECIMEN QUALITY:: ADEQUATE

## 2021-02-02 ENCOUNTER — Telehealth: Payer: Self-pay

## 2021-02-02 NOTE — Telephone Encounter (Signed)
C/o migraine x 2 days. States she has a PCP but really doesn't see them. Patient said since she was just seen here and had labs she wanted to ask if Karma Ganja, NP would be willing to presribe her something for a migraine.  C/O migraine Headache in back of pain/neck pain. Takes Ibuprofen and Tylenol. Amitriptyline has not prevented headaches. Was prescribed by PCP. Takes it but still Has headaches twice weekly with an occasional migraine.Estimates 2-3 migraines monthly.  She is aware that Claiborne Billings is off today but will return to the office tomorrow. She wants to wait and have me ask Claiborne Billings about  This.

## 2021-02-03 ENCOUNTER — Other Ambulatory Visit: Payer: Self-pay

## 2021-02-03 ENCOUNTER — Encounter: Payer: Self-pay | Admitting: Nurse Practitioner

## 2021-02-03 ENCOUNTER — Other Ambulatory Visit: Payer: Self-pay | Admitting: Nurse Practitioner

## 2021-02-03 ENCOUNTER — Telehealth: Payer: Self-pay

## 2021-02-03 DIAGNOSIS — Z1231 Encounter for screening mammogram for malignant neoplasm of breast: Secondary | ICD-10-CM | POA: Diagnosis not present

## 2021-02-03 DIAGNOSIS — Z30430 Encounter for insertion of intrauterine contraceptive device: Secondary | ICD-10-CM

## 2021-02-03 MED ORDER — SUMATRIPTAN SUCCINATE 25 MG PO TABS: 25.0000 mg | ORAL_TABLET | ORAL | 0 refills | Status: DC | PRN

## 2021-02-03 NOTE — Telephone Encounter (Signed)
Front desk/Claudia had a question regarding scheduling IUD insertion for her.    "Will plan insertion with next menses. Continue OCP until ready for insertion" per [facility] note 01-18-21  Patient stated Claiborne Billings told her insert as soon as possible.  Does patient need to wait for menses for iud insertion?  I think Claiborne Billings said "insert sooner than later".  Advised to wait on menses to schedule.

## 2021-02-03 NOTE — Progress Notes (Signed)
Spoke to patient. Discussed that Imitrex 25 mg is $14.41 at [facility]. Pt states she will try. Also will plan to d/c OCP and come infor IUD insertion next week

## 2021-02-03 NOTE — Telephone Encounter (Signed)
Thank you, spoke to patient

## 2021-02-03 NOTE — Telephone Encounter (Signed)
called patient back but received her voice mail. I left detailed message and asked her to call but also told her that I thought phones went off at 4:15pm and she may not be able to get through.

## 2021-02-03 NOTE — Telephone Encounter (Signed)
I am not sure that I will be able to call right at 3pm, but will call when done with patients. If you get an opportunity, call you ask if she would like to try Imitrex because a 25 mg dose, 9 tablets, can be sent to [facility] using [facility] and pay less than $16.00.

## 2021-02-03 NOTE — Telephone Encounter (Signed)
I spoke with patient and read her [facility] reply message.  Her answers to the questions are as follows:  #1 Yes, light sensitivity and nausea yesterday. #2 No Fever. #3 No neurological symptoms. #4 Yes, takes OTC Tylenol and Ibuprofen with little to no relief. #5 Never took the Imitrex because insurance would only let her get #5 at a time and still too expensive.  Patient wanted you to know that she has had a Neurology work up as well with [facility] and did the program with them with visits every two weeks for 3 months.  Still no relief.  Nothing has ever completely relieved her migraine. She said when it is bad the back of her head hurts so bad she cannot lay down on the back of her head.  How it was yesterday evening. She said yesterday she was under so much stress that she felt like she had muscle knots in her shoulder blades and that just intensified the headache.  She is fine with scheduling the IUD as long as it does not cost her. Hayley said it is for contraceptive management and should be covered 100%. Rosemarie Ax will be calling her to arrange appointment.  She said to tell you she is scheduled for mammogram at 2:30pm today and if you want to talk with her to call her after 3pm.

## 2021-02-03 NOTE — Telephone Encounter (Signed)
Left message for patient to call.

## 2021-02-04 ENCOUNTER — Telehealth: Payer: Self-pay | Admitting: *Deleted

## 2021-02-04 NOTE — Telephone Encounter (Signed)
Patient called asking if Imtrex 25 mg tablet can be increased to another dose, patient reports she took 3 tablets yesterday 2 in the am and 1 in the pm and no relief. Please advise

## 2021-02-04 NOTE — Telephone Encounter (Signed)
Left detailed message on cell per DPR access. 

## 2021-02-04 NOTE — Telephone Encounter (Signed)
Please advise Candice that I think she needs an evaluation. I would not recommend a higher dose at this time until she has had an evaluation. Her headache has gone on too long. My suggestion is her PCP or Urgent care.

## 2021-02-07 DIAGNOSIS — M50122 Cervical disc disorder at C5-C6 level with radiculopathy: Secondary | ICD-10-CM | POA: Diagnosis not present

## 2021-02-07 DIAGNOSIS — M9903 Segmental and somatic dysfunction of lumbar region: Secondary | ICD-10-CM | POA: Diagnosis not present

## 2021-02-07 DIAGNOSIS — M5116 Intervertebral disc disorders with radiculopathy, lumbar region: Secondary | ICD-10-CM | POA: Diagnosis not present

## 2021-02-07 DIAGNOSIS — M9901 Segmental and somatic dysfunction of cervical region: Secondary | ICD-10-CM | POA: Diagnosis not present

## 2021-02-08 ENCOUNTER — Ambulatory Visit: Payer: BC Managed Care – PPO | Admitting: Nurse Practitioner

## 2021-02-10 ENCOUNTER — Encounter: Payer: Self-pay | Admitting: Nurse Practitioner

## 2021-02-10 ENCOUNTER — Other Ambulatory Visit: Payer: Self-pay

## 2021-02-10 ENCOUNTER — Other Ambulatory Visit: Payer: Self-pay | Admitting: Internal Medicine

## 2021-02-10 ENCOUNTER — Ambulatory Visit (INDEPENDENT_AMBULATORY_CARE_PROVIDER_SITE_OTHER): Payer: BC Managed Care – PPO | Admitting: Nurse Practitioner

## 2021-02-10 VITALS — BP 120/78 | HR 76 | Resp 16 | Wt 155.0 lb

## 2021-02-10 DIAGNOSIS — Z01812 Encounter for preprocedural laboratory examination: Secondary | ICD-10-CM | POA: Diagnosis not present

## 2021-02-10 DIAGNOSIS — Z113 Encounter for screening for infections with a predominantly sexual mode of transmission: Secondary | ICD-10-CM | POA: Diagnosis not present

## 2021-02-10 DIAGNOSIS — Z30430 Encounter for insertion of intrauterine contraceptive device: Secondary | ICD-10-CM

## 2021-02-10 LAB — PREGNANCY, URINE: Preg Test, Ur: NEGATIVE

## 2021-02-10 NOTE — Progress Notes (Signed)
45 y.o. G13P1001 Married Caucasian female presents for insertion of Mirena IUD.  Pt counseled about risks and benefits as well as complications.  Consent obtained.    Current contraception: ocp Last STD testing:  Collected today LMP:  Patient's last menstrual period was 02/09/2021 (exact date).  Patient Active Problem List   Diagnosis Date Noted  . GERD (gastroesophageal reflux disease) 02/11/2020  . Back pain 12/26/2018  . Abnormal weight gain 12/15/2016  . Frequent headaches 12/15/2016   Past Medical History:  Diagnosis Date  . Abnormal Pap smear 2004   Hx of abnormal pap smear   . Headache(784.0) 04/17/12   "maybe once/wk"  . Migraines   . Sleep disturbance, unspecified    "just not sleeping well sometimes"   Current Outpatient Medications on File Prior to Visit  Medication Sig Dispense Refill  . amitriptyline (ELAVIL) 50 MG tablet TAKE 1 TABLET BY MOUTH EVERY NIGHT AT BEDTIME 90 tablet 0  . amphetamine-dextroamphetamine (ADDERALL) 20 MG tablet Take 20 mg by mouth 3 (three) times daily.    Marland Kitchen ibuprofen (ADVIL) 800 MG tablet TAKE 1 TABLET(800 MG) BY MOUTH DAILY AS NEEDED 90 tablet 0  . Levonorgestrel-Ethinyl Estradiol (AMETHIA) 0.15-0.03 &0.01 MG tablet Take 1 tablet by mouth daily. 91 tablet 4  . SUMAtriptan (IMITREX) 25 MG tablet Take 1 tablet (25 mg total) by mouth every 2 (two) hours as needed for migraine (do not take more than 4 tablets in 24 hours). May repeat in 2 hours if headache persists or recurs. 9 tablet 0  . traZODone (DESYREL) 50 MG tablet Take 50-150 mg by mouth at bedtime.     No current facility-administered medications on file prior to visit.   Topamax [topiramate]  Review of Systems  Constitutional: Negative.   HENT: Negative.   Eyes: Negative.   Respiratory: Negative.   Cardiovascular: Negative.   Gastrointestinal: Negative.   Genitourinary: Negative.   Musculoskeletal: Negative.   Skin: Negative.   Neurological: Negative.   Endo/Heme/Allergies:  Negative.   Psychiatric/Behavioral: Negative.    Vitals:   02/10/21 1553  BP: 120/78  Pulse: 76  Resp: 16  Weight: 155 lb (70.3 kg)    Gen:  [facility] healthy female NAD  Pelvic exam: Vulva:  normal female genitalia Vagina:  normal vagina and small amount of menstrual blood Cervix:  Non-tender, Negative CMT, no lesions or redness. Uterus:  normal shape, position and consistency   Procedure:  Speculum inserted. GC/CT collected today.  Cervix visualized and cleansed with Betadine x 3.  Single toothed tenaculum applied to anterior lip of cervix without difficulty. Unable to pass sound. Tenaculum applied to posterior cervix. Dilator inserted, then able to sound uterus to 7cm.   IUD inserted.  Strings cut to 3 cm.  Minimal bleeding noted.  Pt tolerated the procedure well.    A: Insertion of Mirena IUD Lot number: [id number].  Expiration:  April 2024    P:  Return for recheck 4-6 weeks Post procedure instructions included in after visit summary Pt aware to call for any concerns Pt aware removal due no later than 02/11/2028 IUD card given to pt.

## 2021-02-10 NOTE — Patient Instructions (Signed)

## 2021-02-11 LAB — C. TRACHOMATIS/N. GONORRHOEAE RNA
C. trachomatis RNA, TMA: NOT DETECTED
N. gonorrhoeae RNA, TMA: NOT DETECTED

## 2021-02-14 NOTE — Addendum Note (Signed)
Addended by: Donivan Scull on: 02/14/2021 09:04 AM   Modules accepted: Orders

## 2021-02-15 ENCOUNTER — Encounter: Payer: Self-pay | Admitting: Anesthesiology

## 2021-03-14 DIAGNOSIS — F4323 Adjustment disorder with mixed anxiety and depressed mood: Secondary | ICD-10-CM | POA: Diagnosis not present

## 2021-04-05 ENCOUNTER — Ambulatory Visit: Payer: BC Managed Care – PPO | Admitting: Nurse Practitioner

## 2021-04-27 ENCOUNTER — Other Ambulatory Visit: Payer: Self-pay | Admitting: Internal Medicine

## 2021-04-27 NOTE — Telephone Encounter (Signed)
Refill request Amitriptyline Last office visit 10/14/20 video visit  Last refill 01/18/21 #90 No upcoming appointment scheduled

## 2021-05-24 ENCOUNTER — Other Ambulatory Visit: Payer: Self-pay | Admitting: Internal Medicine

## 2021-06-13 DIAGNOSIS — G4700 Insomnia, unspecified: Secondary | ICD-10-CM | POA: Diagnosis not present

## 2021-06-13 DIAGNOSIS — F900 Attention-deficit hyperactivity disorder, predominantly inattentive type: Secondary | ICD-10-CM | POA: Diagnosis not present

## 2021-06-13 DIAGNOSIS — F4321 Adjustment disorder with depressed mood: Secondary | ICD-10-CM | POA: Diagnosis not present

## 2021-08-04 ENCOUNTER — Other Ambulatory Visit: Payer: Self-pay | Admitting: Family

## 2021-08-17 ENCOUNTER — Other Ambulatory Visit: Payer: Self-pay | Admitting: Internal Medicine

## 2021-08-17 NOTE — Telephone Encounter (Signed)
Pt called checking on status of her medication  Amitriptyline HCl 50 MG

## 2021-08-17 NOTE — Telephone Encounter (Signed)
Name of Medication: Amitriptyline 50 mg Name of Pharmacy: [facility] [facility] Last Fill or Written Date and Quantity: # 90 on 04/27/21 Last Office Visit and Type: video visit on 10/14/20 for H/A Next Office Visit and Type:none scheduled

## 2021-08-18 MED ORDER — AMITRIPTYLINE HCL 50 MG PO TABS: 50.0000 mg | ORAL_TABLET | Freq: Every day | ORAL | 0 refills | Status: DC

## 2021-08-18 NOTE — Telephone Encounter (Signed)
Per DPR left v/m to pt that rx requested was sent to [facility] [address] [facility] [address].

## 2021-09-08 ENCOUNTER — Other Ambulatory Visit: Payer: Self-pay | Admitting: Family

## 2021-11-23 ENCOUNTER — Other Ambulatory Visit: Payer: Self-pay | Admitting: Family

## 2022-01-03 ENCOUNTER — Encounter: Payer: Self-pay | Admitting: Emergency Medicine

## 2022-01-03 ENCOUNTER — Other Ambulatory Visit: Payer: Self-pay

## 2022-01-03 ENCOUNTER — Ambulatory Visit
Admission: EM | Admit: 2022-01-03 | Discharge: 2022-01-03 | Disposition: A | Discharge status: Home / Self Care | Payer: BC Managed Care – PPO | Attending: Internal Medicine | Admitting: Internal Medicine

## 2022-01-03 DIAGNOSIS — R197 Diarrhea, unspecified: Secondary | ICD-10-CM

## 2022-01-03 DIAGNOSIS — G8929 Other chronic pain: Secondary | ICD-10-CM

## 2022-01-03 DIAGNOSIS — M542 Cervicalgia: Secondary | ICD-10-CM

## 2022-01-03 DIAGNOSIS — A084 Viral intestinal infection, unspecified: Secondary | ICD-10-CM

## 2022-01-03 DIAGNOSIS — R112 Nausea with vomiting, unspecified: Secondary | ICD-10-CM | POA: Diagnosis not present

## 2022-01-03 MED ORDER — KETOROLAC TROMETHAMINE 30 MG/ML IJ SOLN
30.0000 mg | Freq: Once | INTRAMUSCULAR | Status: AC
  Administered 2022-01-03: 30 mg via INTRAMUSCULAR

## 2022-01-03 MED ORDER — ONDANSETRON 4 MG PO TBDP
4.0000 mg | ORAL_TABLET | Freq: Once | ORAL | Status: AC
  Administered 2022-01-03: 4 mg via ORAL

## 2022-01-03 NOTE — Discharge Instructions (Signed)
You were given nausea medication and pain medication in urgent care today.  Please increase clear oral fluid intake to prevent dehydration.  Go to the hospital if symptoms worsen.  Please follow-up with orthopedist if neck pain persists.

## 2022-01-03 NOTE — ED Triage Notes (Signed)
Pt here for neck pain that is chronic in nature worse yesterday and today; pt sts some nausea and acid reflux feeling with diarrhea this am

## 2022-01-03 NOTE — ED Provider Notes (Signed)
[facility]    CSN: [id number] Arrival date & time: 01/03/22  1225      History   Chief Complaint Chief Complaint  Patient presents with   Nausea   Diarrhea    HPI Phyllis Morales is a 46 y.o. female.   Patient presents with 2 complaints.  She presents with neck pain that has been progressively worse over the past 2 days.  She reports that she has chronic neck pain but has frequent flareups.  She usually goes to the chiropractor when pain is exacerbated but has not been able to make it to the chiropractor.  She reports that she has been followed by an orthopedist for this and had imaging of the neck where she was told that she has minimal tissue between her vertebrae in her neck.  Denies any numbness or tingling.  She has taken ibuprofen 800 mg for pain yesterday.  Pain is located on the right side of the neck.  Patient has full range of motion of neck.  Denies any recent or past injuries to the neck.  Patient also reports that she developed nausea, vomiting, diarrhea this morning.  Denies any blood in stool or emesis.  Denies any known fevers or sick contacts.  Patient has been able to drink fluids this morning but has not been able to keep any foods down.  Denies any associated upper respiratory symptoms.  She has generalized crampy abdominal pain that is intermittent.   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 Authorizing Provider  amitriptyline (ELAVIL) 50 MG tablet TAKE 1 TABLET BY MOUTH EVERY NIGHT AT BEDTIME 08/18/21   Dutch Quint B, FNP  amitriptyline (ELAVIL) 50 MG tablet Take 1 tablet (50 mg total) by mouth at bedtime. 08/18/21   Dutch Quint B, FNP  amphetamine-dextroamphetamine (ADDERALL) 20 MG tablet Take 20 mg by mouth 3 (three) times daily. 12/21/20   [provider]  ibuprofen (ADVIL) 800 MG tablet TAKE 1 TABLET(800 MG) BY MOUTH  DAILY AS NEEDED 01/18/21   Jearld Fenton, NP  levonorgestrel (MIRENA) 20 MCG/24HR IUD 1 each by Intrauterine route once. Inserted 02/10/2021    [provider]  omeprazole (PRILOSEC) 20 MG capsule TAKE 1 CAPSULE(20 MG) BY MOUTH DAILY 05/24/21   Dutch Quint B, FNP  SUMAtriptan (IMITREX) 25 MG tablet Take 1 tablet (25 mg total) by mouth every 2 (two) hours as needed for migraine (do not take more than 4 tablets in 24 hours). May repeat in 2 hours if headache persists or recurs. 02/03/21   Karma Ganja, NP  traZODone (DESYREL) 50 MG tablet Take 50-150 mg by mouth at bedtime. 01/12/21   [provider]    Family History Family History  Problem Relation Age of Onset   Hyperlipidemia Mother    Arthritis Mother    Stroke Father     Social History Social History   Tobacco Use   Smoking status: Never   Smokeless tobacco: Never  Substance Use Topics   Alcohol use: Yes    Comment: 2-3 times a month    Drug use: No     Allergies   Topamax [topiramate]   Review of Systems Review of Systems Per HPI  Physical Exam Triage Vital Signs ED Triage Vitals [01/03/22 1248]  Enc Vitals Group     BP 131/88     Pulse Rate 98     Resp 18     Temp 98.3 F (36.8 C)     Temp Source Oral     SpO2 98 %     Weight      Height      Head Circumference      Peak Flow      Pain Score 3     Pain Loc      Pain Edu?      Excl. in [place]?    No data found.  Updated Vital Signs BP 131/88 (BP Location: Left Arm)    Pulse 98    Temp 98.3 F (36.8 C) (Oral)    Resp 18    SpO2 98%   Visual Acuity Right Eye Distance:   Left Eye Distance:   Bilateral Distance:    Right Eye Near:   Left Eye Near:    Bilateral Near:     Physical Exam Constitutional:      General: She is not in acute distress.    Appearance: Normal appearance. She is not toxic-appearing or diaphoretic.  HENT:     Head: Normocephalic and atraumatic.     Mouth/Throat:     Pharynx: No posterior oropharyngeal erythema.  Eyes:     Extraocular Movements: Extraocular movements intact.     Conjunctiva/sclera: Conjunctivae normal.     Pupils: Pupils are equal, round, and reactive to light.  Neck:     Comments: Tenderness to palpation to paraspinal muscles of cervical spine at right side.  Full range of motion.  Grip strength 5/5. Cardiovascular:     Rate and Rhythm: Normal rate and regular rhythm.     Pulses: Normal pulses.     Heart sounds: Normal heart sounds.  Pulmonary:     Effort: Pulmonary effort is normal.     Breath sounds: Normal breath sounds.  Abdominal:     General: Bowel sounds are normal. There is no distension.     Palpations: Abdomen is soft.     Tenderness: There is no abdominal tenderness.  Musculoskeletal:     Cervical back: Normal range of motion. No  rigidity or crepitus. Pain with movement and muscular tenderness present. No spinous process tenderness.  Neurological:     General: No focal deficit  present.     Mental Status: She is alert and oriented to person, place, and time. Mental status is at baseline.  Psychiatric:        Mood and Affect: Mood normal.        Behavior: Behavior normal.        Thought Content: Thought content normal.        Judgment: Judgment normal.     UC Treatments / Results  Labs (all labs ordered are listed, but only abnormal results are displayed) Labs Reviewed - No data to display  EKG   Radiology No results found.  Procedures Procedures (including critical care time)  Medications Ordered in UC Medications  ondansetron (ZOFRAN-ODT) disintegrating tablet 4 mg (4 mg Oral Given 01/03/22 1323)  ketorolac (TORADOL) 30 MG/ML injection 30 mg (30 mg Intramuscular Given 01/03/22 1323)    Initial Impression / Assessment and Plan / UC Course  I have reviewed the triage vital signs and the nursing notes.  Pertinent labs & imaging results that were available during my care of the patient were reviewed by me and considered in my medical decision making (see chart for details).     Patient's gastrointestinal symptoms appear viral in nature.  Differential diagnosis may be viral gastroenteritis.  One dose of Zofran given in urgent care today as I am unable to prescribe a course of Zofran given patient's daily medications and interactions that may occur.  Patient to increase clear oral fluid intake to prevent dehydration.  No signs of current dehydration.  Discussed strict return and ER precautions.  Neck pain is chronic in nature.  Do not think that imaging is necessary given no apparent injury and chronicity of pain.  Ketorolac injection IM was given in urgent care today.  I do think that 1 dose is safe given that patient already takes ibuprofen as needed as prescribed by her PCP and has no interactions.  Patient to follow-up with orthopedist for further evaluation and management given chronic neck pain.  Discussed ice application and heat application with  patient.  Discussed return precautions.  Patient verbalized understanding and was agreeable with plan. Final Clinical Impressions(s) / UC Diagnoses   Final diagnoses:  Nausea vomiting and diarrhea  Viral gastroenteritis  Chronic neck pain     Discharge Instructions      You were given nausea medication and pain medication in urgent care today.  Please increase clear oral fluid intake to prevent dehydration.  Go to the hospital if symptoms worsen.  Please follow-up with orthopedist if neck pain persists.    ED Prescriptions   None    PDMP not reviewed this encounter.   Teodora Medici, [place] 01/03/22 1334

## 2022-01-09 ENCOUNTER — Telehealth: Payer: Self-pay

## 2022-01-09 NOTE — Telephone Encounter (Signed)
Patient called in voice mail stating she would like to talk with a nurse or Karma Ganja, NP as he has some questions.  I called her back and left message that Claiborne Billings D no longer with our practice but I will be happy to help her with her questions. I did inform her we have two nurse practitioners on staff who will be happy to continue her care.  I asked her to call me back.

## 2022-01-13 NOTE — Telephone Encounter (Signed)
Patient called back stating she has noticed bleeding after intercourse and longer cycles. Has IUD, reports being concerned. I advised patient to schedule office visit with provider. Message sent to appointments. ?

## 2022-01-17 ENCOUNTER — Other Ambulatory Visit (HOSPITAL_COMMUNITY)
Admission: RE | Admit: 2022-01-17 | Discharge: 2022-01-17 | Disposition: A | Discharge status: Home / Self Care | Payer: BC Managed Care – PPO | Source: Ambulatory Visit | Attending: Nurse Practitioner | Admitting: Nurse Practitioner

## 2022-01-17 ENCOUNTER — Ambulatory Visit (INDEPENDENT_AMBULATORY_CARE_PROVIDER_SITE_OTHER): Payer: BC Managed Care – PPO | Admitting: Nurse Practitioner

## 2022-01-17 ENCOUNTER — Encounter: Payer: Self-pay | Admitting: Nurse Practitioner

## 2022-01-17 ENCOUNTER — Other Ambulatory Visit: Payer: Self-pay

## 2022-01-17 VITALS — BP 122/76 | Ht 59.0 in | Wt 155.0 lb

## 2022-01-17 DIAGNOSIS — Z30432 Encounter for removal of intrauterine contraceptive device: Secondary | ICD-10-CM

## 2022-01-17 DIAGNOSIS — Z30011 Encounter for initial prescription of contraceptive pills: Secondary | ICD-10-CM | POA: Diagnosis not present

## 2022-01-17 DIAGNOSIS — Z01419 Encounter for gynecological examination (general) (routine) without abnormal findings: Secondary | ICD-10-CM | POA: Insufficient documentation

## 2022-01-17 DIAGNOSIS — Z1211 Encounter for screening for malignant neoplasm of colon: Secondary | ICD-10-CM | POA: Diagnosis not present

## 2022-01-17 MED ORDER — NORETHIN ACE-ETH ESTRAD-FE 1-20 MG-MCG PO TABS: 1.0000 | ORAL_TABLET | Freq: Every day | ORAL | 4 refills | Status: DC

## 2022-01-17 NOTE — Telephone Encounter (Signed)
AEX scheduled 01/17/22 with Jonelle Sidle, NP. ?

## 2022-01-17 NOTE — Progress Notes (Signed)
? ?Phyllis Morales 20-May-1976 [id number] ? ? ?History:  46 y.o. G1P1001 presents for annual exam. Mirena IUD 01/2021. She has monthly cycles that last longer than they did prior to insertion. On OCPs she had cycles every few months and prefers going back to this and would like to take continuously for menstrual suppression. 2004 LEEP. Normal mammogram history.  ? ?Gynecologic History ?Patient's last menstrual period was 01/03/2022. ?Period Cycle (Days): 28 ?Period Duration (Days): 7 ?Period Pattern: Regular ?Menstrual Flow: Moderate ?Dysmenorrhea: None ?Contraception/Family planning: IUD ?Sexually active: Yes ? ?Health Maintenance ?Last Pap: 10/13/2019. Results were: Normal, 3-year repeat ?Last mammogram: 02/03/2021. Results were: Normal ?Last colonoscopy: Never ?Last Dexa: Not indicated ? ?Past medical history, past surgical history, family history and social history were all reviewed and documented in the EPIC chart. Married. Works Engineer, petroleum at surgical center. 91 yo son, married and lives local.  ? ?ROS:  A ROS was performed and pertinent positives and negatives are included. ? ?Exam: ? ?Vitals:  ? 01/17/22 1435  ?BP: 122/76  ?Weight: 155 lb (70.3 kg)  ?Height: '4\' 11"'$  (1.499 m)  ? ?Body mass index is 31.31 kg/m?. ? ?General appearance:  Normal ?Thyroid:  Symmetrical, normal in size, without palpable masses or nodularity. ?Respiratory ? Auscultation:  Clear without wheezing or rhonchi ?Cardiovascular ? Auscultation:  Regular rate, without rubs, murmurs or gallops ? Edema/varicosities:  Not grossly evident ?Abdominal ? Soft,nontender, without masses, guarding or rebound. ? Liver/spleen:  No organomegaly noted ? Hernia:  None appreciated ? Skin ? Inspection:  Grossly normal ?Breasts: Examined lying and sitting.  ? Right: Without masses, retractions, nipple discharge or axillary adenopathy. ? ? Left: Without masses, retractions, nipple discharge or axillary adenopathy. ?Genitourinary  ? Inguinal/mons:  Normal without  inguinal adenopathy ? External genitalia:  Normal appearing vulva with no masses, tenderness, or lesions ? BUS/Urethra/Skene's glands:  Normal ? Vagina:  Normal appearing with normal color and discharge, no lesions ? Cervix:  Normal appearing without discharge or lesions. IUD string visible ? Uterus:  Normal in size, shape and contour.  Midline and mobile, nontender ? Adnexa/parametria:   ?  Rt: Normal in size, without masses or tenderness. ?  Lt: Normal in size, without masses or tenderness. ? Anus and perineum: Normal ? Digital rectal exam: Normal sphincter tone without palpated masses or tenderness ? ?Patient informed chaperone available to be present for breast and pelvic exam. Patient has requested no chaperone to be present. Patient has been advised what will be completed during breast and pelvic exam.  ? ?Assessment/Plan:  46 y.o. G1P1001 for annual exam.  ? ?Well female exam with routine gynecological exam - Plan: Cytology - PAP( [facility]). Education provided on SBEs, importance of preventative screenings, current guidelines, high calcium diet, regular exercise, and multivitamin daily.  Has appointment with new PCP and will get labs done then.  ? ?Encounter for initial prescription of contraceptive pills - Plan: norethindrone-ethinyl estradiol-FE (JUNEL FE 1/20) 1-20 MG-MCG tablet continuously. She prefers to suppress menses. She is aware of slight risk for blood clots. Will use backup contraception for first 7 days.  ? ?Screening for colon cancer - Plan: Cologuard. No personal risk factors or family history of colon cancer. She is candidate for Cologuard and would prefer this screening tool.  ? ?Encounter for IUD removal - IUD removed with ease, shown to patient and discarded.  ? ?Screening for cervical cancer - 2004 LEEP. Pap due today.  ? ?Screening for breast cancer - Normal mammogram history.  Continue annual screenings. Scheduled 02/03/2022. Normal breast exam today. ? ?Return in 1 year for annual.   ? ? ? ?Tamela Gammon DNP, 3:06 PM 01/17/2022 ? ?

## 2022-01-18 LAB — CYTOLOGY - PAP
Adequacy: ABSENT
Comment: NEGATIVE
Diagnosis: NEGATIVE
High risk HPV: NEGATIVE

## 2022-01-23 ENCOUNTER — Ambulatory Visit (INDEPENDENT_AMBULATORY_CARE_PROVIDER_SITE_OTHER): Payer: BC Managed Care – PPO | Admitting: Nurse Practitioner

## 2022-01-23 ENCOUNTER — Other Ambulatory Visit: Payer: Self-pay

## 2022-01-23 ENCOUNTER — Encounter: Payer: Self-pay | Admitting: Nurse Practitioner

## 2022-01-23 VITALS — BP 116/76 | HR 65 | Temp 98.5°F | Ht 59.06 in | Wt 156.1 lb

## 2022-01-23 DIAGNOSIS — Z7689 Persons encountering health services in other specified circumstances: Secondary | ICD-10-CM | POA: Diagnosis not present

## 2022-01-23 DIAGNOSIS — M5030 Other cervical disc degeneration, unspecified cervical region: Secondary | ICD-10-CM | POA: Diagnosis not present

## 2022-01-23 DIAGNOSIS — Z6831 Body mass index (BMI) 31.0-31.9, adult: Secondary | ICD-10-CM

## 2022-01-23 DIAGNOSIS — G43909 Migraine, unspecified, not intractable, without status migrainosus: Secondary | ICD-10-CM

## 2022-01-23 MED ORDER — SUMATRIPTAN SUCCINATE 100 MG PO TABS: 100.0000 mg | ORAL_TABLET | ORAL | 3 refills | Status: DC | PRN

## 2022-01-23 MED ORDER — IBUPROFEN 800 MG PO TABS: ORAL_TABLET | ORAL | 1 refills | Status: DC

## 2022-01-23 MED ORDER — SUMATRIPTAN SUCCINATE 100 MG PO TABS: ORAL_TABLET | ORAL | 3 refills | Status: DC

## 2022-01-23 NOTE — Progress Notes (Unsigned)
New Patient Office Visit  Subjective:  Patient ID: Phyllis Morales, female    DOB: Sep 24, 1976  Age: 46 y.o. MRN: [id number]  CC:  Chief Complaint  Patient presents with   New Patient (Initial Visit)    HPI Phyllis Morales presents to establish new primary care provider.  -history of migraines. She does take ibuprofen and imitrex if needed for acute migraines.  -neck pain - degenerative disc disease in cervical spine. Might be triggering migraine headaches  -does have a GYN provider. Just had yearly CPE. Scheduled for mammogram on 02/13/2022.  -due to have routine, fasting blood work.    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 ( Needs pap yearly)   CHOLECYSTECTOMY  04/17/2012   Procedure: LAPAROSCOPIC CHOLECYSTECTOMY WITH INTRAOPERATIVE CHOLANGIOGRAM;  Surgeon: Adin Hector, MD;  Location: [place];  Service: General;  Laterality: N/A;   DILATION AND CURETTAGE OF UTERUS  ~ 2002   LIPOSUCTION      Family History  Problem Relation Age of Onset   Hyperlipidemia Mother    Arthritis Mother    Stroke Father     Social History   Socioeconomic History   Marital status: Married    Spouse name: Not on file   Number of children: Not on file   Years of education: Not on file   Highest education level: Not on file  Occupational History   Not on file  Tobacco Use   Smoking status: Never   Smokeless tobacco: Never  Substance and Sexual Activity   Alcohol use: Yes    Comment: 1 time a month   Drug use: No   Sexual activity: Yes    Partners: Male    Birth control/protection: I.U.D.    Comment: Mirena inserted 01-2021  Other Topics  Concern   Not on file  Social History Narrative   Not on file   Social Determinants of Health   Financial Resource Strain: Not on file  Food Insecurity: Not on file  Transportation Needs: Not on file  Physical Activity: Not on file  Stress: Not on file  Social Connections: Not on file  Intimate Partner Violence: Not on file    ROS Review of Systems  Objective:   Today's Vitals   01/23/22 1504  BP: 116/76  Pulse: 65  Temp: 98.5 F (36.9 C)  SpO2: 99%  Weight: 156 lb 1.6 oz (70.8 kg)  Height: 4' 11.06" (1.5 m)   Body mass index is 31.47 kg/m.   Physical Exam  Assessment & Plan:   Problem List Items Addressed This Visit   None   Outpatient Encounter Medications as of 01/23/2022  Medication Sig   amitriptyline (ELAVIL) 50 MG tablet TAKE 1 TABLET BY MOUTH EVERY NIGHT AT BEDTIME   amitriptyline (ELAVIL) 50 MG tablet Take 1 tablet (50 mg total) by mouth at bedtime.   amphetamine-dextroamphetamine (ADDERALL) 20 MG tablet Take 20 mg by mouth 3 (three) times daily.   ibuprofen (ADVIL) 800 MG tablet TAKE 1 TABLET(800 MG) BY MOUTH DAILY AS NEEDED   norethindrone-ethinyl estradiol-FE (JUNEL FE  1/20) 1-20 MG-MCG tablet Take 1 tablet by mouth daily. Take ACTIVE pills only, skipping PLACEBO pills   omeprazole (PRILOSEC) 20 MG capsule TAKE 1 CAPSULE(20 MG) BY MOUTH DAILY   SUMAtriptan (IMITREX) 25 MG tablet Take 1 tablet (25 mg total) by mouth every 2 (two) hours as needed for migraine (do not take more than 4 tablets in 24 hours). May repeat in 2 hours if headache persists or recurs.   traZODone (DESYREL) 50 MG tablet Take 50-150 mg by mouth at bedtime.   No facility-administered encounter medications on file as of 01/23/2022.    Follow-up: No follow-ups on file.   Ronnell Freshwater, NP

## 2022-02-01 DIAGNOSIS — Z6836 Body mass index (BMI) 36.0-36.9, adult: Secondary | ICD-10-CM | POA: Insufficient documentation

## 2022-02-01 DIAGNOSIS — Z6832 Body mass index (BMI) 32.0-32.9, adult: Secondary | ICD-10-CM | POA: Insufficient documentation

## 2022-02-01 DIAGNOSIS — G43909 Migraine, unspecified, not intractable, without status migrainosus: Secondary | ICD-10-CM | POA: Insufficient documentation

## 2022-02-01 DIAGNOSIS — M5030 Other cervical disc degeneration, unspecified cervical region: Secondary | ICD-10-CM | POA: Insufficient documentation

## 2022-02-01 DIAGNOSIS — Z6831 Body mass index (BMI) 31.0-31.9, adult: Secondary | ICD-10-CM | POA: Insufficient documentation

## 2022-02-16 DIAGNOSIS — Z1231 Encounter for screening mammogram for malignant neoplasm of breast: Secondary | ICD-10-CM | POA: Diagnosis not present

## 2022-02-20 ENCOUNTER — Encounter: Payer: Self-pay | Admitting: Nurse Practitioner

## 2022-02-20 DIAGNOSIS — N951 Menopausal and female climacteric states: Secondary | ICD-10-CM | POA: Diagnosis not present

## 2022-02-20 DIAGNOSIS — R5383 Other fatigue: Secondary | ICD-10-CM | POA: Diagnosis not present

## 2022-02-20 DIAGNOSIS — E7800 Pure hypercholesterolemia, unspecified: Secondary | ICD-10-CM | POA: Diagnosis not present

## 2022-02-20 DIAGNOSIS — R635 Abnormal weight gain: Secondary | ICD-10-CM | POA: Diagnosis not present

## 2022-02-22 DIAGNOSIS — N951 Menopausal and female climacteric states: Secondary | ICD-10-CM | POA: Diagnosis not present

## 2022-02-22 DIAGNOSIS — R635 Abnormal weight gain: Secondary | ICD-10-CM | POA: Diagnosis not present

## 2022-02-22 DIAGNOSIS — K219 Gastro-esophageal reflux disease without esophagitis: Secondary | ICD-10-CM | POA: Diagnosis not present

## 2022-02-22 DIAGNOSIS — Z1331 Encounter for screening for depression: Secondary | ICD-10-CM | POA: Diagnosis not present

## 2022-02-22 DIAGNOSIS — Z6831 Body mass index (BMI) 31.0-31.9, adult: Secondary | ICD-10-CM | POA: Diagnosis not present

## 2022-02-22 DIAGNOSIS — Z1339 Encounter for screening examination for other mental health and behavioral disorders: Secondary | ICD-10-CM | POA: Diagnosis not present

## 2022-02-23 DIAGNOSIS — F900 Attention-deficit hyperactivity disorder, predominantly inattentive type: Secondary | ICD-10-CM | POA: Diagnosis not present

## 2022-02-23 DIAGNOSIS — F4322 Adjustment disorder with anxiety: Secondary | ICD-10-CM | POA: Diagnosis not present

## 2022-02-27 ENCOUNTER — Telehealth: Payer: Self-pay | Admitting: Nurse Practitioner

## 2022-02-27 NOTE — Telephone Encounter (Signed)
I have only seen her once as a new patient. She is scheduled for a physical on 4/24 and we can discuss this at that time.  We will need to get labs first.

## 2022-02-27 NOTE — Telephone Encounter (Signed)
I'm sorry to keep sending messages back-she just had fasting labs and ekg done and it's back in your basket. She called to get the fax number and she sent them over.  ?

## 2022-02-27 NOTE — Telephone Encounter (Signed)
Please apologize for me, but I don't do the HCG injections at all.

## 2022-02-27 NOTE — Telephone Encounter (Signed)
Patient is aware. Patient is asking if you can write for the [facility]? ?

## 2022-02-27 NOTE — Telephone Encounter (Signed)
Ok. Thank you. We can talk about the best medication for her to start at her visit 03/06/2022.

## 2022-02-27 NOTE — Telephone Encounter (Signed)
Patient is requesting if you would consider prescribing her HCG injections for weight loss? She went to [facility] however they use a different injection and it was over $600 and she cannot afford that. Please advise. [phone] ?

## 2022-02-28 ENCOUNTER — Other Ambulatory Visit: Payer: Self-pay | Admitting: Family

## 2022-03-01 ENCOUNTER — Telehealth: Payer: Self-pay | Admitting: Nurse Practitioner

## 2022-03-01 NOTE — Telephone Encounter (Signed)
I still don't see lab results. Did she have them done here? But generally, yes. I can write for something for weight management.

## 2022-03-01 NOTE — Telephone Encounter (Signed)
I know we have been back and fourth with this but she just called again and said basically she wants to know for sure since you've gotten her labs if you will write for a weight loss medication or she will just cancel the appointment. Again-i'm sorry to send you another message this is just what she said.  ?

## 2022-03-01 NOTE — Telephone Encounter (Signed)
Discussed where to find labs and patient is aware.  ?

## 2022-03-06 ENCOUNTER — Encounter: Payer: BC Managed Care – PPO | Admitting: Nurse Practitioner

## 2022-03-08 ENCOUNTER — Ambulatory Visit (INDEPENDENT_AMBULATORY_CARE_PROVIDER_SITE_OTHER): Payer: BC Managed Care – PPO | Admitting: Nurse Practitioner

## 2022-03-08 ENCOUNTER — Encounter: Payer: Self-pay | Admitting: Nurse Practitioner

## 2022-03-08 VITALS — BP 109/69 | HR 71 | Temp 97.7°F | Ht 59.06 in | Wt 161.1 lb

## 2022-03-08 DIAGNOSIS — F411 Generalized anxiety disorder: Secondary | ICD-10-CM

## 2022-03-08 DIAGNOSIS — Z0001 Encounter for general adult medical examination with abnormal findings: Secondary | ICD-10-CM | POA: Diagnosis not present

## 2022-03-08 DIAGNOSIS — Z6832 Body mass index (BMI) 32.0-32.9, adult: Secondary | ICD-10-CM | POA: Diagnosis not present

## 2022-03-08 DIAGNOSIS — G43909 Migraine, unspecified, not intractable, without status migrainosus: Secondary | ICD-10-CM | POA: Diagnosis not present

## 2022-03-08 MED ORDER — SAXENDA 18 MG/3ML ~~LOC~~ SOPN: PEN_INJECTOR | SUBCUTANEOUS | 2 refills | Status: DC

## 2022-03-08 MED ORDER — PEN NEEDLES 32G X 5 MM MISC: 1 refills | Status: DC

## 2022-03-08 NOTE — Progress Notes (Signed)
Established patient visit ? ? ?Patient: Phyllis Morales   DOB: Jul 20, 1976   46 y.o. Female  MRN: [id number] ?Visit Date: 03/08/2022 ? ? ?Chief Complaint  ?Patient presents with  ? Annual Exam  ? ?Subjective  ?  ?HPI  ?Annual wellness visit ?--routine, fasting labs done at her initial visit at [facility]. Cholesterol was within normal limits. Blood sugars were well controlled.  ?--wants to be started on saxenda for weight management. Has been on phentermine in the past. This did not help. When combined with HCG injections this did not help.  ? ? ?Medications: ?Outpatient Medications Prior to Visit  ?Medication Sig  ? ALPRAZolam (XANAX) 1 MG tablet Take 1 mg by mouth daily as needed.  ? amitriptyline (ELAVIL) 50 MG tablet TAKE 1 TABLET BY MOUTH EVERY NIGHT AT BEDTIME  ? amitriptyline (ELAVIL) 50 MG tablet Take 1 tablet (50 mg total) by mouth at bedtime.  ? amphetamine-dextroamphetamine (ADDERALL) 20 MG tablet Take 20 mg by mouth 3 (three) times daily.  ? ibuprofen (ADVIL) 800 MG tablet TAKE 1 TABLET(800 MG) BY MOUTH DAILY AS NEEDED  ? norethindrone-ethinyl estradiol-FE (JUNEL FE 1/20) 1-20 MG-MCG tablet Take 1 tablet by mouth daily. Take ACTIVE pills only, skipping PLACEBO pills  ? omeprazole (PRILOSEC) 20 MG capsule TAKE 1 CAPSULE(20 MG) BY MOUTH DAILY  ? SUMAtriptan (IMITREX) 100 MG tablet Take 1 tablet po once prn for acute migraine. May repeat dose in 2 hours for persistent migraine.  ? traZODone (DESYREL) 50 MG tablet Take 50-150 mg by mouth at bedtime.  ? ?No facility-administered medications prior to visit.  ? ? ?Review of Systems  ?Constitutional:  Negative for activity change, appetite change, chills, fatigue and fever.  ?     Difficult time with weight loss  ?HENT:  Negative for congestion, postnasal drip, rhinorrhea, sinus pressure, sinus pain, sneezing and sore throat.   ?Eyes: Negative.   ?Respiratory:  Negative for cough, chest tightness, shortness of breath and wheezing.   ?Cardiovascular:  Negative for  chest pain and palpitations.  ?Gastrointestinal:  Negative for abdominal pain, constipation, diarrhea, nausea and vomiting.  ?Endocrine: Negative for cold intolerance, heat intolerance, polydipsia and polyuria.  ?Genitourinary:  Negative for dyspareunia, dysuria, flank pain, frequency and urgency.  ?Musculoskeletal:  Negative for arthralgias, back pain and myalgias.  ?Skin:  Negative for rash.  ?Allergic/Immunologic: Negative for environmental allergies.  ?Neurological:  Negative for dizziness, weakness and headaches.  ?Hematological:  Negative for adenopathy.  ?Psychiatric/Behavioral:  Positive for decreased concentration and dysphoric mood. The patient is nervous/anxious.   ?     Well managed on current medications. Sees psychiatry   ? ? ? ? Objective  ?  ?BP 109/69   Pulse 71   Temp 97.7 ?F (36.5 ?C)   Ht 4' 11.06" (1.5 m)   Wt 161 lb 1.6 oz (73.1 kg)   LMP 02/13/2022   SpO2 100%   BMI 32.48 kg/m?  ?BP Readings from Last 3 Encounters:  ?03/08/22 109/69  ?01/23/22 116/76  ?01/17/22 122/76  ?  ?Wt Readings from Last 3 Encounters:  ?03/08/22 161 lb 1.6 oz (73.1 kg)  ?01/23/22 156 lb 1.6 oz (70.8 kg)  ?01/17/22 155 lb (70.3 kg)  ?  ?Physical Exam ?Vitals and nursing note reviewed.  ?Constitutional:   ?   Appearance: Normal appearance. She is well-developed.  ?HENT:  ?   Head: Normocephalic and atraumatic.  ?   Right Ear: Tympanic membrane, ear canal and external ear normal.  ?  Left Ear: Tympanic membrane, ear canal and external ear normal.  ?   Nose: Nose normal.  ?   Mouth/Throat:  ?   Mouth: Mucous membranes are moist.  ?   Pharynx: Oropharynx is clear.  ?Eyes:  ?   Extraocular Movements: Extraocular movements intact.  ?   Conjunctiva/sclera: Conjunctivae normal.  ?   Pupils: Pupils are equal, round, and reactive to light.  ?Cardiovascular:  ?   Rate and Rhythm: Normal rate and regular rhythm.  ?   Pulses: Normal pulses.  ?   Heart sounds: Normal heart sounds.  ?Pulmonary:  ?   Effort: Pulmonary effort is  normal.  ?   Breath sounds: Normal breath sounds.  ?Abdominal:  ?   General: Bowel sounds are normal. There is no distension.  ?   Palpations: Abdomen is soft. There is no mass.  ?   Tenderness: There is no abdominal tenderness. There is no right CVA tenderness, left CVA tenderness, guarding or rebound.  ?   Hernia: No hernia is present.  ?Musculoskeletal:     ?   General: Normal range of motion.  ?   Cervical back: Normal range of motion and neck supple.  ?Lymphadenopathy:  ?   Cervical: No cervical adenopathy.  ?Skin: ?   General: Skin is warm and dry.  ?   Capillary Refill: Capillary refill takes less than 2 seconds.  ?Neurological:  ?   General: No focal deficit present.  ?   Mental Status: She is alert and oriented to person, place, and time.  ?Psychiatric:     ?   Mood and Affect: Mood normal.     ?   Behavior: Behavior normal.     ?   Thought Content: Thought content normal.     ?   Judgment: Judgment normal.  ?  ? ? Assessment & Plan  ?  ?1. Encounter for general adult medical examination with abnormal findings ?Annual physical today. Reviewed labs with patient from [facility] clinic.  ? ?2. Body mass index (BMI) of 32.0-32.9 in adult ?Trial Saxenda. Reviewed instructions to titrate dosing weekly from 0.'6mg'$  daily up to '3mg'$  daily if tolerating well. Discussed lowering calorie intake to 1500 calories per day and incorporating exercise into daily routine to help lose weight.  ?- Liraglutide -Weight Management (SAXENDA) 18 MG/3ML SOPN; Inject 0.6 mg  once daily x 1 wk. Then increase dose by 0.6 mg/day every 7 days to target of 3 mg/day.  Dispense: 15 mL; Refill: 2 ?- Insulin Pen Needle (PEN NEEDLES) 32G X 5 MM MISC; To use daily with Saxenda injections  Dispense: 100 each; Refill: 1 ? ?3. Acute migraine ?Generally stable. Continue current medications as prescribed  ? ?4. Generalized anxiety disorder ?Stable. Continue regular visits with psychiatry as scheduled.  ? ?  ?Problem List Items Addressed This Visit    ? ?  ? Cardiovascular and Mediastinum  ? Acute migraine  ?  ? Other  ? Body mass index (BMI) of 32.0-32.9 in adult  ? Relevant Medications  ? Liraglutide -Weight Management (SAXENDA) 18 MG/3ML SOPN  ? Insulin Pen Needle (PEN NEEDLES) 32G X 5 MM MISC  ? Generalized anxiety disorder  ? Relevant Medications  ? ALPRAZolam (XANAX) 1 MG tablet  ? ?Other Visit Diagnoses   ? ? Encounter for general adult medical examination with abnormal findings    -  Primary  ? ?  ?  ? ?Return in 2 months (on 05/08/2022).  ?   ? ? ? ? ?  Ronnell Freshwater, NP  ?[facility] ?[phone] (phone) ?[phone] (fax) ? ?[facility]  ?

## 2022-03-08 NOTE — Patient Instructions (Signed)
Fat and Cholesterol Restricted Eating Plan ?Getting too much fat and cholesterol in your diet may cause health problems. Choosing the right foods helps keep your fat and cholesterol at normal levels. This can keep you from getting certain diseases. ?Your doctor may recommend an eating plan that includes: ?Total fat: ______% or less of total calories a day. This is ______g of fat a day. ?Saturated fat: ______% or less of total calories a day. This is ______g of saturated fat a day. ?Cholesterol: less than _________mg a day. ?Fiber: ______g a day. ?What are tips for following this plan? ?General tips ?Work with your doctor to lose weight if you need to. ?Avoid: ?Foods with added sugar. ?Fried foods. ?Foods with trans fat or partially hydrogenated oils. This includes some margarines and baked goods. ?If you drink alcohol: ?Limit how much you have to: ?0-1 drink a day for women who are not pregnant. ?0-2 drinks a day for men. ?Know how much alcohol is in a drink. In the [place] one drink equals one 12 oz bottle of beer (355 mL), one 5 oz glass of wine (148 mL), or one 1? oz glass of hard liquor (44 mL). ?Reading food labels ?Check food labels for: ?Trans fats. ?Partially hydrogenated oils. ?Saturated fat (g) in each serving. ?Cholesterol (mg) in each serving. ?Fiber (g) in each serving. ?Choose foods with healthy fats, such as: ?Monounsaturated fats and polyunsaturated fats. These include olive and canola oil, flaxseeds, walnuts, almonds, and seeds. ?Omega-3 fats. These are found in certain fish, flaxseed oil, and ground flaxseeds. ?Choose grain products that have whole grains. Look for the word "whole" as the first word in the ingredient list. ?Cooking ?Cook foods using low-fat methods. These include baking, boiling, grilling, and broiling. ?Eat more home-cooked foods. Eat at restaurants and buffets less often. Eat less fast food. ?Avoid cooking using saturated fats, such as butter, cream, palm oil, palm kernel oil, and  coconut oil. ?Meal planning ? ?At meals, divide your plate into four equal parts: ?Fill one-half of your plate with vegetables, green salads, and fruit. ?Fill one-fourth of your plate with whole grains. ?Fill one-fourth of your plate with low-fat (lean) protein foods. ?Eat fish that is high in omega-3 fats at least two times a week. This includes mackerel, tuna, sardines, and salmon. ?Eat foods that are high in fiber, such as whole grains, beans, apples, pears, berries, broccoli, carrots, peas, and barley. ?What foods should I eat? ?Fruits ?All fresh, canned (in natural juice), or frozen fruits. ?Vegetables ?Fresh or frozen vegetables (raw, steamed, roasted, or grilled). Green salads. ?Grains ?Whole grains, such as whole wheat or whole grain breads, crackers, cereals, and pasta. Unsweetened oatmeal, bulgur, barley, quinoa, or brown rice. Corn or whole wheat flour tortillas. ?Meats and other protein foods ?Ground beef (85% or leaner), grass-fed beef, or beef trimmed of fat. Skinless chicken or [place]. Ground chicken or [place]. Pork trimmed of fat. All fish and seafood. Egg whites. Dried beans, peas, or lentils. Unsalted nuts or seeds. Unsalted canned beans. Nut butters without added sugar or oil. ?Dairy ?Low-fat or nonfat dairy products, such as skim or 1% milk, 2% or reduced-fat cheeses, low-fat and fat-free ricotta or cottage cheese, or plain low-fat and nonfat yogurt. ?Fats and oils ?Tub margarine without trans fats. Light or reduced-fat mayonnaise and salad dressings. Avocado. Olive, canola, sesame, or safflower oils. ?The items listed above may not be a complete list of foods and beverages you can eat. Contact a dietitian for more information. ?What foods  should I avoid? ?Fruits ?Canned fruit in heavy syrup. Fruit in cream or butter sauce. Fried fruit. ?Vegetables ?Vegetables cooked in cheese, cream, or butter sauce. Fried vegetables. ?Grains ?White bread. White pasta. White rice. Cornbread. Bagels, pastries,  and croissants. Crackers and snack foods that contain trans fat and hydrogenated oils. ?Meats and other protein foods ?Fatty cuts of meat. Ribs, chicken wings, bacon, sausage, bologna, salami, chitterlings, fatback, hot dogs, bratwurst, and packaged lunch meats. Liver and organ meats. Whole eggs and egg yolks. Chicken and [place] with skin. Fried meat. ?Dairy ?Whole or 2% milk, cream, half-and-half, and cream cheese. Whole milk cheeses. Whole-fat or sweetened yogurt. Full-fat cheeses. Nondairy creamers and whipped toppings. Processed cheese, cheese spreads, and cheese curds. ?Fats and oils ?Butter, stick margarine, lard, shortening, ghee, or bacon fat. Coconut, palm kernel, and palm oils. ?Beverages ?Alcohol. Sugar-sweetened drinks such as sodas, lemonade, and fruit drinks. ?Sweets and desserts ?Corn syrup, sugars, honey, and molasses. Candy. Jam and jelly. Syrup. Sweetened cereals. Cookies, pies, cakes, donuts, muffins, and ice cream. ?The items listed above may not be a complete list of foods and beverages you should avoid. Contact a dietitian for more information. ?Summary ?Choosing the right foods helps keep your fat and cholesterol at normal levels. This can keep you from getting certain diseases. ?At meals, fill one-half of your plate with vegetables, green salads, and fruits. ?Eat high fiber foods, like whole grains, beans, apples, pears, berries, carrots, peas, and barley. ?Limit added sugar, saturated fats, alcohol, and fried foods. ?This information is not intended to replace advice given to you by your health care provider. Make sure you discuss any questions you have with your health care provider. ?Document Revised: 03/11/2021 Document Reviewed: 03/11/2021 ?Elsevier Patient Education ? [place]. ? ?

## 2022-03-09 ENCOUNTER — Other Ambulatory Visit: Payer: Self-pay | Admitting: Family

## 2022-03-13 DIAGNOSIS — F411 Generalized anxiety disorder: Secondary | ICD-10-CM | POA: Insufficient documentation

## 2022-03-14 ENCOUNTER — Other Ambulatory Visit: Payer: Self-pay | Admitting: Family

## 2022-03-14 ENCOUNTER — Telehealth: Payer: Self-pay | Admitting: Nurse Practitioner

## 2022-03-14 MED ORDER — AMITRIPTYLINE HCL 50 MG PO TABS: 50.0000 mg | ORAL_TABLET | Freq: Every day | ORAL | 0 refills | Status: DC

## 2022-03-14 NOTE — Telephone Encounter (Signed)
lmtc

## 2022-03-14 NOTE — Telephone Encounter (Signed)
Patient requesting a refill of amitriptyline and also stated the saxenda is not covered by her insurance is there an alternative to this? Please advise.  ?

## 2022-03-14 NOTE — Telephone Encounter (Signed)
Per Nira Conn ok to send refill of Amitriptyline. RX sent to pharmacy.  ? ?In regard to Saxenda, patient will need to contact her insurance company to see which medications, if any, will be covered by insurance for weight loss. AS, CMA ? ?

## 2022-03-26 NOTE — Progress Notes (Signed)
Reviewed with patient during visit

## 2022-05-08 ENCOUNTER — Ambulatory Visit: Payer: BC Managed Care – PPO | Admitting: Nurse Practitioner

## 2022-05-22 ENCOUNTER — Other Ambulatory Visit: Payer: Self-pay | Admitting: Nurse Practitioner

## 2022-05-22 ENCOUNTER — Telehealth: Payer: Self-pay | Admitting: Nurse Practitioner

## 2022-05-22 DIAGNOSIS — K219 Gastro-esophageal reflux disease without esophagitis: Secondary | ICD-10-CM

## 2022-05-22 MED ORDER — OMEPRAZOLE 20 MG PO CPDR: 20.0000 mg | DELAYED_RELEASE_CAPSULE | Freq: Every day | ORAL | 0 refills | Status: DC

## 2022-05-22 NOTE — Telephone Encounter (Signed)
I filled this and sent it to [facility]

## 2022-05-22 NOTE — Telephone Encounter (Signed)
lmtc

## 2022-05-22 NOTE — Telephone Encounter (Signed)
Patient requesting refill of Omeprazole. Please advise.

## 2022-05-23 NOTE — Telephone Encounter (Signed)
close

## 2022-06-13 ENCOUNTER — Other Ambulatory Visit: Payer: Self-pay | Admitting: Nurse Practitioner

## 2022-06-22 ENCOUNTER — Telehealth: Payer: Self-pay | Admitting: Nurse Practitioner

## 2022-06-22 NOTE — Telephone Encounter (Signed)
She will have to be seen to discuss options. She was last seen in April and was asked to have return visit in two months. When she has appointment, we can talk about some less expensive options.

## 2022-06-22 NOTE — Telephone Encounter (Signed)
Patient is requesting if you will call her in something for weight loss that is cheaper than what was last sent in for her. Please advise.

## 2022-06-23 ENCOUNTER — Telehealth: Payer: Self-pay | Admitting: *Deleted

## 2022-06-23 NOTE — Telephone Encounter (Signed)
Called pt LVM to contact the office °

## 2022-06-23 NOTE — Telephone Encounter (Signed)
Patient called in triage requesting medication for symptoms. Left message for patient to call.

## 2022-06-26 NOTE — Telephone Encounter (Signed)
Called pt LVM to contact the office °

## 2022-07-20 ENCOUNTER — Telehealth: Payer: Self-pay | Admitting: Nurse Practitioner

## 2022-07-20 NOTE — Telephone Encounter (Signed)
Patients insurance will not cover the [place] and is requesting a different type of weight loss medication. Please advise.

## 2022-07-24 NOTE — Telephone Encounter (Signed)
Please have her find out from her insurance what they will cover for weight loss. If it is Wegovy, I cannot prescribe it right now, as it is on national back order.

## 2022-07-24 NOTE — Telephone Encounter (Signed)
Called pt LVM to contact the office °

## 2022-07-25 NOTE — Telephone Encounter (Signed)
Called pt LVM to contact the office °

## 2022-08-17 ENCOUNTER — Other Ambulatory Visit: Payer: Self-pay

## 2022-08-17 DIAGNOSIS — K219 Gastro-esophageal reflux disease without esophagitis: Secondary | ICD-10-CM

## 2022-08-17 MED ORDER — OMEPRAZOLE 20 MG PO CPDR: 20.0000 mg | DELAYED_RELEASE_CAPSULE | Freq: Every day | ORAL | 0 refills | Status: DC

## 2022-09-26 ENCOUNTER — Other Ambulatory Visit: Payer: Self-pay | Admitting: Nurse Practitioner

## 2022-10-12 ENCOUNTER — Telehealth: Payer: Self-pay | Admitting: *Deleted

## 2022-10-12 NOTE — Telephone Encounter (Signed)
LVM for pt to call office back from the message she left on VM.Phyllis Morales, CMA

## 2022-10-16 ENCOUNTER — Telehealth: Payer: Self-pay | Admitting: *Deleted

## 2022-10-16 NOTE — Telephone Encounter (Signed)
Pt called to schedule an appointment for weight loss.  She said that she had been seen previously for this and a rx for wegovy was sent in, she said that she could not afford that with the discount cards that were available and wanted to see if there is something else that could be sent in for her to try before her appointment on 10/26/22.  She said that nothing has changed as far as her health. Please advise. Phyllis Morales, CMA

## 2022-10-16 NOTE — Telephone Encounter (Signed)
I understand that nothing has changed. However, It has been about 6 months since her last visit. I need to see her to talk about other options. It has really just been such a long time that we need to touch base face to face.  Thanks so much.   -HB

## 2022-10-17 NOTE — Telephone Encounter (Signed)
Contacted pt and she is scheduled to come in for a visit.Phyllis Morales, CMA

## 2022-10-17 NOTE — Telephone Encounter (Signed)
Pt informed of below.Charnetta Wulff Zimmerman Rumple, CMA ? ?

## 2022-10-26 ENCOUNTER — Encounter: Payer: Self-pay | Admitting: Nurse Practitioner

## 2022-10-26 ENCOUNTER — Ambulatory Visit (INDEPENDENT_AMBULATORY_CARE_PROVIDER_SITE_OTHER): Payer: BC Managed Care – PPO | Admitting: Nurse Practitioner

## 2022-10-26 VITALS — BP 115/76 | HR 85 | Ht 59.0 in | Wt 183.0 lb

## 2022-10-26 DIAGNOSIS — Z6836 Body mass index (BMI) 36.0-36.9, adult: Secondary | ICD-10-CM | POA: Diagnosis not present

## 2022-10-26 DIAGNOSIS — E8881 Metabolic syndrome: Secondary | ICD-10-CM

## 2022-10-26 DIAGNOSIS — E88819 Insulin resistance, unspecified: Secondary | ICD-10-CM

## 2022-10-26 MED ORDER — OZEMPIC (0.25 OR 0.5 MG/DOSE) 2 MG/3ML ~~LOC~~ SOPN: 0.2500 mg | PEN_INJECTOR | SUBCUTANEOUS | 2 refills | Status: DC

## 2022-10-26 NOTE — Progress Notes (Signed)
Established patient visit   Patient: Phyllis Morales   DOB: 1976-09-29   46 y.o. Female  MRN: [id number] Visit Date: 10/26/2022   Chief Complaint  Patient presents with   Weight Check   Subjective    HPI  Follow up -options for weight management  --has been on phentermine which was ineffective.  --was tried on phentermine with HCG injections which were also ineffective --now on adderall so stimulant weight loss medication not indicated  --insurance covered wegovy but not saxenda. Was very expensive for her even with copay card.  -no longer on adderall.  --feels better now. Less migraines    Medications: Outpatient Medications Prior to Visit  Medication Sig   ALPRAZolam (XANAX) 1 MG tablet Take 1 mg by mouth daily as needed.   amitriptyline (ELAVIL) 50 MG tablet TAKE 1 TABLET BY MOUTH EVERY NIGHT AT BEDTIME   amitriptyline (ELAVIL) 50 MG tablet TAKE 1 TABLET(50 MG) BY MOUTH AT BEDTIME   ibuprofen (ADVIL) 800 MG tablet TAKE 1 TABLET(800 MG) BY MOUTH DAILY AS NEEDED   Insulin Pen Needle (PEN NEEDLES) 32G X 5 MM MISC To use daily with Saxenda injections   norethindrone-ethinyl estradiol-FE (JUNEL FE 1/20) 1-20 MG-MCG tablet Take 1 tablet by mouth daily. Take ACTIVE pills only, skipping PLACEBO pills   omeprazole (PRILOSEC) 20 MG capsule Take 1 capsule (20 mg total) by mouth daily.   SUMAtriptan (IMITREX) 100 MG tablet Take 1 tablet po once prn for acute migraine. May repeat dose in 2 hours for persistent migraine.   traZODone (DESYREL) 50 MG tablet Take 50-150 mg by mouth at bedtime.   [DISCONTINUED] Liraglutide -Weight Management (SAXENDA) 18 MG/3ML SOPN Inject 0.6 mg [place] once daily x 1 wk. Then increase dose by 0.6 mg/day every 7 days to target of 3 mg/day.   [DISCONTINUED] amphetamine-dextroamphetamine (ADDERALL) 20 MG tablet Take 20 mg by mouth 3 (three) times daily.   No facility-administered medications prior to visit.    Review of Systems  Constitutional:  Positive for  unexpected weight change. Negative for activity change, appetite change, chills, fatigue and fever.       Weight gain of 22 pounds since last visit in 02/2022.   HENT:  Negative for congestion, postnasal drip, rhinorrhea, sinus pressure, sinus pain, sneezing and sore throat.   Eyes: Negative.   Respiratory:  Negative for cough, chest tightness, shortness of breath and wheezing.   Cardiovascular:  Negative for chest pain and palpitations.  Gastrointestinal:  Negative for abdominal pain, constipation, diarrhea, nausea and vomiting.  Endocrine: Negative for cold intolerance, heat intolerance, polydipsia and polyuria.  Genitourinary:  Negative for dyspareunia, dysuria, flank pain, frequency and urgency.  Musculoskeletal:  Negative for arthralgias, back pain and myalgias.  Skin:  Negative for rash.  Allergic/Immunologic: Negative for environmental allergies.  Neurological:  Negative for dizziness, weakness and headaches.  Hematological:  Negative for adenopathy.  Psychiatric/Behavioral:  Positive for dysphoric mood. The patient is nervous/anxious.        Objective     Today's Vitals   10/26/22 1358  BP: 115/76  Pulse: 85  SpO2: 99%  Weight: 183 lb (83 kg)  Height: '4\' 11"'$  (1.499 m)   Body mass index is 36.96 kg/m.  BP Readings from Last 3 Encounters:  10/26/22 115/76  03/08/22 109/69  01/23/22 116/76    Wt Readings from Last 3 Encounters:  10/26/22 183 lb (83 kg)  03/08/22 161 lb 1.6 oz (73.1 kg)  01/23/22 156 lb 1.6 oz (70.8 kg)  Physical Exam Vitals and nursing note reviewed.  Constitutional:      Appearance: Normal appearance. She is well-developed.  HENT:     Head: Normocephalic and atraumatic.     Nose: Nose normal.     Mouth/Throat:     Mouth: Mucous membranes are moist.     Pharynx: Oropharynx is clear.  Eyes:     Extraocular Movements: Extraocular movements intact.     Conjunctiva/sclera: Conjunctivae normal.     Pupils: Pupils are equal, round, and reactive  to light.  Cardiovascular:     Rate and Rhythm: Normal rate and regular rhythm.     Pulses: Normal pulses.     Heart sounds: Normal heart sounds.  Pulmonary:     Effort: Pulmonary effort is normal.     Breath sounds: Normal breath sounds.  Abdominal:     Palpations: Abdomen is soft.  Musculoskeletal:        General: Normal range of motion.     Cervical back: Normal range of motion and neck supple.  Lymphadenopathy:     Cervical: No cervical adenopathy.  Skin:    General: Skin is warm and dry.     Capillary Refill: Capillary refill takes less than 2 seconds.  Neurological:     General: No focal deficit present.     Mental Status: She is alert and oriented to person, place, and time.  Psychiatric:        Mood and Affect: Mood normal.        Behavior: Behavior normal.        Thought Content: Thought content normal.        Judgment: Judgment normal.       Assessment & Plan    1. Metabolic syndrome After insurance would not approve wegovy or ozempic, decided on trial phentermine 37.5 mg tablets. Discussed lowering calorie intake to 1500 calories per day and incorporating exercise into daily routine to help lose weight.  - Semaglutide,0.25 or 0.'5MG'$ /DOS, (OZEMPIC, 0.25 OR 0.5 MG/DOSE,) 2 MG/3ML SOPN; Inject 0.25 mg into the skin once a week.  Dispense: 3 mL; Refill: 2 - phentermine (ADIPEX-P) 37.5 MG tablet; Take 1 tablet (37.5 mg total) by mouth daily before breakfast.  Dispense: 30 tablet; Refill: 1  2. BMI 36.0-36.9,adult After insurance would not approve wegovy or ozempic, decided on trial phentermine 37.5 mg tablets. Discussed lowering calorie intake to 1500 calories per day and incorporating exercise into daily routine to help lose weight.  - phentermine (ADIPEX-P) 37.5 MG tablet; Take 1 tablet (37.5 mg total) by mouth daily before breakfast.  Dispense: 30 tablet; Refill: 1   Problem List Items Addressed This Visit       Endocrine   Insulin resistance   Relevant  Medications   Semaglutide,0.25 or 0.'5MG'$ /DOS, (OZEMPIC, 0.25 OR 0.5 MG/DOSE,) 2 MG/3ML SOPN     Other   BMI 36.0-36.9,adult   Relevant Medications   phentermine (ADIPEX-P) 37.5 MG tablet   Metabolic syndrome - Primary   Relevant Medications   Semaglutide,0.25 or 0.'5MG'$ /DOS, (OZEMPIC, 0.25 OR 0.5 MG/DOSE,) 2 MG/3ML SOPN   phentermine (ADIPEX-P) 37.5 MG tablet     Return in about 2 months (around 12/27/2022) for routine - weight management.         Ronnell Freshwater, NP  [facility] [phone] (phone) [phone] (fax)  [place]

## 2022-10-31 ENCOUNTER — Telehealth: Payer: Self-pay | Admitting: *Deleted

## 2022-10-31 NOTE — Telephone Encounter (Signed)
Please tell her, per our visit, I think that phentermine is the only viable option. Does she want to try this? I can send new prescription to pharmacy.  Thanks  -HB

## 2022-10-31 NOTE — Telephone Encounter (Signed)
LVM message requesting a return call

## 2022-10-31 NOTE — Telephone Encounter (Signed)
Called pt she is advised of her insurance has denied her for the Ozempic pt stated that the PCP was going to talk about other alternatives

## 2022-10-31 NOTE — Telephone Encounter (Signed)
Pt left message yeasterday inquiring about the authorization for the medication (Ozempic) that was sent in for her.  She mentioned that she was told it would take 24 hours and pharmacy had not heard anything about the authorization. She said it was the [employer]. [place] Savage Zimmerman Rumple, CMA

## 2022-11-01 NOTE — Telephone Encounter (Signed)
Called pt LVM to contact the office °

## 2022-11-02 NOTE — Telephone Encounter (Signed)
Called pt LVM to contact the office °

## 2022-11-09 NOTE — Telephone Encounter (Signed)
Pt is requesting to trya PA for [facility] and in the mean time willing to try the phentermine.  Pharmacy: [facility] [place], [place] - [address]

## 2022-11-09 NOTE — Telephone Encounter (Signed)
[employer] will not cover Mounjaro due to patient not being diagnosed as Diabetic type 2.   Please send script for Phentermine.

## 2022-11-09 NOTE — Telephone Encounter (Signed)
NOTE NOT NEEDED ?

## 2022-11-13 DIAGNOSIS — E88819 Insulin resistance, unspecified: Secondary | ICD-10-CM | POA: Insufficient documentation

## 2022-11-13 DIAGNOSIS — E8881 Metabolic syndrome: Secondary | ICD-10-CM | POA: Insufficient documentation

## 2022-11-13 MED ORDER — PHENTERMINE HCL 37.5 MG PO TABS: 37.5000 mg | ORAL_TABLET | Freq: Every day | ORAL | 1 refills | Status: DC

## 2022-11-13 NOTE — Telephone Encounter (Signed)
Please let the patient know that I did send the phentermine to [facility] on [address]. Thanks  -HB

## 2022-11-14 NOTE — Telephone Encounter (Signed)
Patient was notified that prescription has been sent to pharmacy as well as her insurance will deny the [facility] because she is not a type 2 diabetic. Patient verbalized understanding. All questions and concerns have been addressed.

## 2022-11-28 ENCOUNTER — Other Ambulatory Visit: Payer: Self-pay | Admitting: *Deleted

## 2022-11-28 DIAGNOSIS — Z30011 Encounter for initial prescription of contraceptive pills: Secondary | ICD-10-CM

## 2022-11-28 MED ORDER — NORETHIN ACE-ETH ESTRAD-FE 1-20 MG-MCG PO TABS: 1.0000 | ORAL_TABLET | Freq: Every day | ORAL | 0 refills | Status: DC

## 2022-11-28 NOTE — Telephone Encounter (Signed)
Med refill request: [place] Fe 1/20 Last AEX:01/17/22 -TW Next AEX: Not scheduled  Last MMG (if hormonal med) 02/16/22, Birads 2, Neg  Rx pended #30/0 RF, needs AEX for future refills Refill authorized: Please Advise?

## 2022-12-04 ENCOUNTER — Telehealth: Payer: Self-pay

## 2022-12-04 ENCOUNTER — Other Ambulatory Visit: Payer: Self-pay | Admitting: Nurse Practitioner

## 2022-12-04 DIAGNOSIS — Z6836 Body mass index (BMI) 36.0-36.9, adult: Secondary | ICD-10-CM

## 2022-12-04 DIAGNOSIS — E8881 Metabolic syndrome: Secondary | ICD-10-CM

## 2022-12-04 DIAGNOSIS — Z30011 Encounter for initial prescription of contraceptive pills: Secondary | ICD-10-CM

## 2022-12-04 DIAGNOSIS — E88819 Insulin resistance, unspecified: Secondary | ICD-10-CM

## 2022-12-04 MED ORDER — NORETHIN ACE-ETH ESTRAD-FE 1-20 MG-MCG PO TABS: 1.0000 | ORAL_TABLET | Freq: Every day | ORAL | 3 refills | Status: DC

## 2022-12-04 MED ORDER — WEGOVY 0.25 MG/0.5ML ~~LOC~~ SOAJ: 0.2500 mg | SUBCUTANEOUS | 1 refills | Status: DC

## 2022-12-04 NOTE — Telephone Encounter (Signed)
I have renewed these and sent them to [facility]. We will se what happens with the wegovy

## 2022-12-04 NOTE — Telephone Encounter (Signed)
Pt is requesting 2 new Rx to be prescribed: Estradiol  Wegovy  Pt has a new insurance that pays more than last year and she is wanting to try [facility] again.   LOV 10/26/22 ROV 12/27/22

## 2022-12-06 ENCOUNTER — Telehealth: Payer: Self-pay | Admitting: *Deleted

## 2022-12-06 ENCOUNTER — Other Ambulatory Visit: Payer: Self-pay

## 2022-12-06 MED ORDER — AMITRIPTYLINE HCL 50 MG PO TABS: ORAL_TABLET | ORAL | 0 refills | Status: DC

## 2022-12-06 NOTE — Telephone Encounter (Signed)
Pt calling requesting the generic for Mounjaro (Zepbound) to be sent in due to pharmacy not having the [facility].  Also she is wanting to see if she could get the estradiol in patch form instead of pill. Please advise.

## 2022-12-08 ENCOUNTER — Other Ambulatory Visit: Payer: Self-pay | Admitting: Nurse Practitioner

## 2022-12-08 DIAGNOSIS — E8881 Metabolic syndrome: Secondary | ICD-10-CM

## 2022-12-08 DIAGNOSIS — Z6836 Body mass index (BMI) 36.0-36.9, adult: Secondary | ICD-10-CM

## 2022-12-08 DIAGNOSIS — E88819 Insulin resistance, unspecified: Secondary | ICD-10-CM

## 2022-12-08 MED ORDER — ZEPBOUND 2.5 MG/0.5ML ~~LOC~~ SOAJ: 2.5000 mg | SUBCUTANEOUS | 1 refills | Status: DC

## 2022-12-08 NOTE — Telephone Encounter (Signed)
Please let her know that I sent the Zepbound to [place]. Her estradiol is combined with norethindrone. I have to see if that comes in patch form.

## 2022-12-11 NOTE — Telephone Encounter (Signed)
LVM for pt to call office to inform her of below.Phyllis Morales, CMA  

## 2022-12-13 NOTE — Telephone Encounter (Signed)
Left message for pt to call office to inform her of below. Kramer Hanrahan Zimmerman Rumple, CMA

## 2022-12-27 ENCOUNTER — Ambulatory Visit: Payer: BC Managed Care – PPO | Admitting: Nurse Practitioner

## 2023-01-08 ENCOUNTER — Other Ambulatory Visit: Payer: Self-pay | Admitting: Nurse Practitioner

## 2023-01-18 ENCOUNTER — Other Ambulatory Visit: Payer: Self-pay | Admitting: Family Medicine

## 2023-01-18 ENCOUNTER — Telehealth: Payer: Self-pay

## 2023-01-18 DIAGNOSIS — Z6836 Body mass index (BMI) 36.0-36.9, adult: Secondary | ICD-10-CM

## 2023-01-18 DIAGNOSIS — E8881 Metabolic syndrome: Secondary | ICD-10-CM

## 2023-01-18 DIAGNOSIS — G43909 Migraine, unspecified, not intractable, without status migrainosus: Secondary | ICD-10-CM

## 2023-01-18 MED ORDER — PHENTERMINE HCL 37.5 MG PO TABS: 37.5000 mg | ORAL_TABLET | Freq: Every day | ORAL | 1 refills | Status: DC

## 2023-01-18 MED ORDER — AMITRIPTYLINE HCL 50 MG PO TABS: ORAL_TABLET | ORAL | 0 refills | Status: DC

## 2023-01-18 MED ORDER — IBUPROFEN 800 MG PO TABS
800.0000 mg | ORAL_TABLET | Freq: Three times a day (TID) | ORAL | 1 refills | Status: DC | PRN
  Filled 2023-04-10: qty 30, 10d supply, fill #0
  Filled 2023-05-09 – 2023-10-23 (×3): qty 90, 30d supply, fill #0

## 2023-01-18 MED ORDER — SUMATRIPTAN SUCCINATE 100 MG PO TABS: ORAL_TABLET | ORAL | 3 refills | Status: DC

## 2023-01-18 NOTE — Telephone Encounter (Signed)
Pt is requesting refills on: amitriptyline (ELAVIL) 50 MG tablet  SUMAtriptan (IMITREX) 100 MG tablet  ibuprofen (ADVIL) 800 MG tablet  phentermine (ADIPEX-P) 37.5 MG tablet  ALPRAZolam (XANAX) 1 MG tablet (No longer seeing the provider that original prescribed it)  Pharmacy: [facility] [place], [address] AT [place]

## 2023-01-23 NOTE — Telephone Encounter (Signed)
Looks like the xanax has been prescribed per Dr. Hale Bogus. She should contact that provider to fill alprazolam for her.

## 2023-01-24 NOTE — Telephone Encounter (Signed)
Called pt LVM to contact the office °

## 2023-01-25 NOTE — Telephone Encounter (Signed)
Called pt LVM to contact the office °

## 2023-02-18 ENCOUNTER — Other Ambulatory Visit: Payer: Self-pay | Admitting: Family Medicine

## 2023-03-07 NOTE — Progress Notes (Signed)
Established patient visit   Patient: Phyllis Morales   DOB: 07/21/1976   47 y.o. Female  MRN: [id number] Visit Date: 03/08/2023   Chief Complaint  Patient presents with   Medical Management of Chronic Issues   Subjective    HPI  Follow up -mood  --anxiety --takes alprazolam as needed  -weight management  --currently taking phentermine  -has had little improvement  -takes infrequently as causes negative side effects  -have been unable to get other medications for weight loss approved by her insurance.  -She denies chest pain, chest pressure, or shortness of breath. She denies headaches or visual disturbances. She denies abdominal pain, nausea, vomiting, or changes in bowel or bladder habits.     Medications: Outpatient Medications Prior to Visit  Medication Sig   ibuprofen (ADVIL) 800 MG tablet TAKE 1 TABLET(800 MG) BY MOUTH DAILY AS NEEDED   norethindrone-ethinyl estradiol-FE (JUNEL FE 1/20) 1-20 MG-MCG tablet Take 1 tablet by mouth daily. Take ACTIVE pills only, skipping PLACEBO pills   omeprazole (PRILOSEC) 20 MG capsule Take 1 capsule (20 mg total) by mouth daily.   traZODone (DESYREL) 50 MG tablet Take 50-150 mg by mouth at bedtime.   [DISCONTINUED] ALPRAZolam (XANAX) 1 MG tablet Take 1 mg by mouth daily as needed.   [DISCONTINUED] amitriptyline (ELAVIL) 50 MG tablet TAKE 1 TABLET(50 MG) BY MOUTH AT BEDTIME   [DISCONTINUED] phentermine (ADIPEX-P) 37.5 MG tablet Take 1 tablet (37.5 mg total) by mouth daily before breakfast.   [DISCONTINUED] SUMAtriptan (IMITREX) 100 MG tablet Take 1 tablet po once prn for acute migraine. May repeat dose in 2 hours for persistent migraine.   [DISCONTINUED] Insulin Pen Needle (PEN NEEDLES) 32G X 5 MM MISC To use daily with Saxenda injections   [DISCONTINUED] tirzepatide (ZEPBOUND) 2.5 MG/0.5ML Pen Inject 2.5 mg into the skin once a week. (Patient not taking: Reported on 03/08/2023)   No facility-administered medications prior to visit.     Review of Systems See HPI       Objective     Today's Vitals   03/08/23 1349  BP: 132/77  Pulse: 76  SpO2: 97%  Weight: 182 lb (82.6 kg)  Height: 4\' 11"  (1.499 m)   Body mass index is 36.76 kg/m.  BP Readings from Last 3 Encounters:  03/08/23 132/77  10/26/22 115/76  03/08/22 109/69    Wt Readings from Last 3 Encounters:  03/08/23 182 lb (82.6 kg)  10/26/22 183 lb (83 kg)  03/08/22 161 lb 1.6 oz (73.1 kg)    Physical Exam Vitals and nursing note reviewed.  Constitutional:      Appearance: Normal appearance. She is well-developed.  HENT:     Head: Normocephalic and atraumatic.     Nose: Nose normal.     Mouth/Throat:     Mouth: Mucous membranes are moist.     Pharynx: Oropharynx is clear.  Eyes:     Extraocular Movements: Extraocular movements intact.     Conjunctiva/sclera: Conjunctivae normal.     Pupils: Pupils are equal, round, and reactive to light.  Neck:     Vascular: No carotid bruit.  Cardiovascular:     Rate and Rhythm: Normal rate and regular rhythm.     Pulses: Normal pulses.     Heart sounds: Normal heart sounds.  Pulmonary:     Effort: Pulmonary effort is normal.     Breath sounds: Normal breath sounds.  Abdominal:     Palpations: Abdomen is soft.  Musculoskeletal:  General: Normal range of motion.     Cervical back: Normal range of motion and neck supple.  Lymphadenopathy:     Cervical: No cervical adenopathy.  Skin:    General: Skin is warm and dry.     Capillary Refill: Capillary refill takes less than 2 seconds.  Neurological:     General: No focal deficit present.     Mental Status: She is alert and oriented to person, place, and time.  Psychiatric:        Mood and Affect: Mood normal.        Behavior: Behavior normal.        Thought Content: Thought content normal.        Judgment: Judgment normal.      Assessment & Plan    Intractable chronic migraine with aura and without status migrainosus Assessment &  Plan: Generally stable.  -continue Elavil 50 mg every evening  -take imitrex as needed and as prescribed for acute headaches   Orders: -     Amitriptyline HCl; Take 1 tablet (50 mg total) by mouth at bedtime.  Dispense: 90 tablet; Refill: 1  Acute migraine Assessment & Plan: Take imitrex as needed and as prescribed for acute migraine headaches   Orders: -     SUMAtriptan Succinate; Take 1 tablet by mouth once as needed for acute migraine. May repeat dose in 2 hours for persistent migraine.  Dispense: 9 tablet; Refill: 3  Generalized anxiety disorder Assessment & Plan: May take alprazolam 1 mg as needed for acute anxiety  -new prescription sent to pharmacy today   Orders: -     ALPRAZolam; Take 1 tablet (1 mg total) by mouth daily as needed.  Dispense: 30 tablet; Refill: 2  BMI 36.0-36.9,adult Assessment & Plan: Trial Zepbound 2.5 mg weekly  Discussed lowering calorie intake to 1500 calories per day and incorporating exercise into daily routine to help lose weight.  Orders: -     Tirzepatide; Inject 2.5 mg into the skin once a week.  Dispense: 2 mL; Refill: 2     Return in about 3 weeks (around 03/29/2023) for routine - weight management, FBW a week prior to visit, add Free T4 to labs .         Carlean Jews, NP  [facility] [phone] (phone) [phone] (fax)  [facility]

## 2023-03-08 ENCOUNTER — Encounter: Payer: Self-pay | Admitting: Nurse Practitioner

## 2023-03-08 ENCOUNTER — Ambulatory Visit (INDEPENDENT_AMBULATORY_CARE_PROVIDER_SITE_OTHER): Payer: BC Managed Care – PPO | Admitting: Nurse Practitioner

## 2023-03-08 ENCOUNTER — Encounter (HOSPITAL_COMMUNITY): Payer: Self-pay

## 2023-03-08 ENCOUNTER — Other Ambulatory Visit (HOSPITAL_COMMUNITY): Payer: Self-pay

## 2023-03-08 VITALS — BP 132/77 | HR 76 | Ht 59.0 in | Wt 182.0 lb

## 2023-03-08 DIAGNOSIS — G43909 Migraine, unspecified, not intractable, without status migrainosus: Secondary | ICD-10-CM | POA: Diagnosis not present

## 2023-03-08 DIAGNOSIS — G43E19 Chronic migraine with aura, intractable, without status migrainosus: Secondary | ICD-10-CM

## 2023-03-08 DIAGNOSIS — F411 Generalized anxiety disorder: Secondary | ICD-10-CM

## 2023-03-08 DIAGNOSIS — Z6836 Body mass index (BMI) 36.0-36.9, adult: Secondary | ICD-10-CM | POA: Diagnosis not present

## 2023-03-08 MED ORDER — SUMATRIPTAN SUCCINATE 100 MG PO TABS
ORAL_TABLET | ORAL | 3 refills | Status: DC
  Filled 2023-03-08 – 2023-05-09 (×2): qty 9, 30d supply, fill #0

## 2023-03-08 MED ORDER — AMITRIPTYLINE HCL 50 MG PO TABS
50.0000 mg | ORAL_TABLET | Freq: Every day | ORAL | 1 refills | Status: DC
  Filled 2023-03-08: qty 30, 30d supply, fill #0
  Filled 2023-04-10: qty 30, 30d supply, fill #1
  Filled 2023-05-09: qty 30, 30d supply, fill #2
  Filled 2023-06-18: qty 30, 30d supply, fill #3
  Filled 2023-07-18: qty 30, 30d supply, fill #4
  Filled 2023-09-07: qty 30, 30d supply, fill #5

## 2023-03-08 MED ORDER — ALPRAZOLAM 1 MG PO TABS
1.0000 mg | ORAL_TABLET | Freq: Every day | ORAL | 2 refills | Status: DC | PRN
  Filled 2023-03-08: qty 30, 30d supply, fill #0
  Filled 2023-05-09: qty 30, 30d supply, fill #1
  Filled 2023-07-23: qty 30, 30d supply, fill #2

## 2023-03-08 MED ORDER — TIRZEPATIDE 2.5 MG/0.5ML ~~LOC~~ SOAJ
2.5000 mg | SUBCUTANEOUS | 2 refills | Status: DC
  Filled 2023-03-08 – 2023-04-03 (×2): qty 2, 28d supply, fill #0

## 2023-03-09 ENCOUNTER — Other Ambulatory Visit (HOSPITAL_COMMUNITY): Payer: Self-pay

## 2023-03-09 ENCOUNTER — Encounter (HOSPITAL_COMMUNITY): Payer: Self-pay

## 2023-03-09 ENCOUNTER — Telehealth: Payer: Self-pay

## 2023-03-09 NOTE — Telephone Encounter (Signed)
Called pt she stated that her insurance needed office notes I told pt once we received the appeal we will fax over the notes

## 2023-03-09 NOTE — Telephone Encounter (Signed)
Pt wants a call back for additional information regarding a PA for [facility].   Pt also wants to add that she has chronic sleep apnea for the appeal.

## 2023-03-14 ENCOUNTER — Other Ambulatory Visit: Payer: Self-pay | Admitting: Nurse Practitioner

## 2023-03-14 DIAGNOSIS — E88819 Insulin resistance, unspecified: Secondary | ICD-10-CM

## 2023-03-14 DIAGNOSIS — E8881 Metabolic syndrome: Secondary | ICD-10-CM

## 2023-03-14 DIAGNOSIS — Z0000 Encounter for general adult medical examination without abnormal findings: Secondary | ICD-10-CM

## 2023-03-22 ENCOUNTER — Other Ambulatory Visit (HOSPITAL_COMMUNITY): Payer: Self-pay

## 2023-03-30 DIAGNOSIS — G43E19 Chronic migraine with aura, intractable, without status migrainosus: Secondary | ICD-10-CM | POA: Insufficient documentation

## 2023-03-30 NOTE — Assessment & Plan Note (Signed)
Take imitrex as needed and as prescribed for acute migraine headaches

## 2023-03-30 NOTE — Assessment & Plan Note (Signed)
Trial Zepbound 2.5 mg weekly. Discussed lowering calorie intake to 1500 calories per day and incorporating exercise into daily routine to help lose weight.  

## 2023-03-30 NOTE — Assessment & Plan Note (Signed)
May take alprazolam 1 mg as needed for acute anxiety  -new prescription sent to pharmacy today

## 2023-03-30 NOTE — Assessment & Plan Note (Signed)
Generally stable.  -continue Elavil 50 mg every evening  -take imitrex as needed and as prescribed for acute headaches

## 2023-04-03 ENCOUNTER — Other Ambulatory Visit (HOSPITAL_COMMUNITY): Payer: Self-pay

## 2023-04-05 ENCOUNTER — Ambulatory Visit: Payer: BC Managed Care – PPO | Admitting: Nurse Practitioner

## 2023-04-05 ENCOUNTER — Other Ambulatory Visit: Payer: BC Managed Care – PPO

## 2023-04-10 ENCOUNTER — Other Ambulatory Visit (HOSPITAL_COMMUNITY): Payer: Self-pay

## 2023-04-17 NOTE — Telephone Encounter (Signed)
Pt called requesting an update on the status of PA.   She states that if the weight loss is denied she needs to be put back on Adderall.

## 2023-04-18 NOTE — Telephone Encounter (Signed)
Called [employer] to get fax number paperwork has been faxed on 04/18/2023

## 2023-04-19 NOTE — Telephone Encounter (Signed)
Called pt LVM to contact the office °

## 2023-04-24 ENCOUNTER — Other Ambulatory Visit (HOSPITAL_COMMUNITY): Payer: Self-pay

## 2023-04-30 NOTE — Telephone Encounter (Signed)
I can go back to phentermine for her. I have never prescribed the adderall for her, so she would have to be seen

## 2023-04-30 NOTE — Telephone Encounter (Signed)
Ok I will give her a call.

## 2023-04-30 NOTE — Telephone Encounter (Signed)
Called pt LVM stating she can contact the office for an appt if  she wants to start a new medication

## 2023-04-30 NOTE — Telephone Encounter (Signed)
Pt is calling back.   Pt is requesting to have a call back if she doesn't answer please leave a detailed message regarding the call per pt.

## 2023-04-30 NOTE — Telephone Encounter (Signed)
Called pt LVM stating that we have re faxed her paperwork and haven't received any response from [facility] I stated that Provider will see about the Adderall

## 2023-05-07 DIAGNOSIS — Z1231 Encounter for screening mammogram for malignant neoplasm of breast: Secondary | ICD-10-CM | POA: Diagnosis not present

## 2023-05-09 ENCOUNTER — Encounter: Payer: Self-pay | Admitting: Nurse Practitioner

## 2023-05-09 ENCOUNTER — Other Ambulatory Visit (HOSPITAL_COMMUNITY): Payer: Self-pay

## 2023-05-14 ENCOUNTER — Other Ambulatory Visit (HOSPITAL_COMMUNITY): Payer: Self-pay

## 2023-05-21 ENCOUNTER — Other Ambulatory Visit (HOSPITAL_COMMUNITY): Payer: Self-pay

## 2023-06-18 ENCOUNTER — Other Ambulatory Visit (HOSPITAL_COMMUNITY): Payer: Self-pay

## 2023-06-26 ENCOUNTER — Other Ambulatory Visit: Payer: Self-pay | Admitting: Nurse Practitioner

## 2023-06-26 DIAGNOSIS — Z30011 Encounter for initial prescription of contraceptive pills: Secondary | ICD-10-CM

## 2023-06-26 NOTE — Telephone Encounter (Signed)
Pt now scheduled for 11/29/2022

## 2023-06-26 NOTE — Telephone Encounter (Signed)
Pt LVM in triage line requesting refills on BCPs.  Med refill request: BCPs Last AEX: 01/17/2022 Next AEX: recall sent per EMR, nothing currently scheduled.  Last MMG (if hormonal med): 05/07/2023-neg birads 1; Cat A Refill authorized: Rx pend.   Will send msg to appt desk and have them reach out to pt for appt.

## 2023-06-27 DIAGNOSIS — F900 Attention-deficit hyperactivity disorder, predominantly inattentive type: Secondary | ICD-10-CM | POA: Diagnosis not present

## 2023-06-27 DIAGNOSIS — F4322 Adjustment disorder with anxiety: Secondary | ICD-10-CM | POA: Diagnosis not present

## 2023-07-18 ENCOUNTER — Other Ambulatory Visit (HOSPITAL_COMMUNITY): Payer: Self-pay

## 2023-07-23 ENCOUNTER — Other Ambulatory Visit (HOSPITAL_COMMUNITY): Payer: Self-pay

## 2023-09-07 ENCOUNTER — Other Ambulatory Visit (HOSPITAL_COMMUNITY): Payer: Self-pay

## 2023-09-16 ENCOUNTER — Other Ambulatory Visit: Payer: Self-pay | Admitting: Nurse Practitioner

## 2023-09-16 DIAGNOSIS — Z30011 Encounter for initial prescription of contraceptive pills: Secondary | ICD-10-CM

## 2023-09-17 NOTE — Telephone Encounter (Signed)
Medication refill request: blisovi fe 1/20 Last AEX:  01-17-22 Next AEX: 09-20-23 Last MMG (if hormonal medication request): 05-07-23 neg Refill authorized: I called patient to make sure you had enough pills to last until her aex on the 7th. No answer. Please deny or approve if appropriate

## 2023-09-20 ENCOUNTER — Encounter: Payer: Self-pay | Admitting: Nurse Practitioner

## 2023-09-20 ENCOUNTER — Other Ambulatory Visit (HOSPITAL_COMMUNITY): Payer: Self-pay

## 2023-09-20 ENCOUNTER — Ambulatory Visit: Payer: BC Managed Care – PPO | Admitting: Nurse Practitioner

## 2023-09-20 VITALS — BP 124/76 | HR 87 | Ht 59.45 in | Wt 183.0 lb

## 2023-09-20 DIAGNOSIS — Z1211 Encounter for screening for malignant neoplasm of colon: Secondary | ICD-10-CM

## 2023-09-20 DIAGNOSIS — Z01419 Encounter for gynecological examination (general) (routine) without abnormal findings: Secondary | ICD-10-CM | POA: Diagnosis not present

## 2023-09-20 DIAGNOSIS — Z3041 Encounter for surveillance of contraceptive pills: Secondary | ICD-10-CM

## 2023-09-20 MED ORDER — NORETHIN ACE-ETH ESTRAD-FE 1-20 MG-MCG PO TABS
1.0000 | ORAL_TABLET | Freq: Every day | ORAL | 3 refills | Status: DC
Start: 2023-09-20 — End: 2023-11-23
  Filled 2023-09-20: qty 28, 21d supply, fill #0
  Filled 2023-11-15: qty 112, 84d supply, fill #0

## 2023-09-20 NOTE — Progress Notes (Signed)
   Phyllis Morales 07/25/1976 [id number]   History:  47 y.o. G1P1001 presents for annual exam. COCs continuously. IUD removed last year due to having longer menses. 2004 LEEP.   Gynecologic History No LMP recorded. (Menstrual status: Other).   Contraception/Family planning: OCP (estrogen/progesterone) Sexually active: Yes  Health Maintenance Last Pap: 01/17/2022. Results were: Normal neg HPV, 3-year repeat Last mammogram: 05/07/2023. Results were: Normal Last colonoscopy: Never Last Dexa: Not indicated  Past medical history, past surgical history, family history and social history were all reviewed and documented in the EPIC chart. Married. 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 58 yo son, married and lives local.   ROS:  A ROS was performed and pertinent positives and negatives are included.  Exam:  Vitals:   09/20/23 1425  BP: 124/76  Pulse: 87  SpO2: 96%  Weight: 183 lb (83 kg)  Height: 4' 11.45" (1.51 m)    Body mass index is 36.41 kg/m.  General appearance:  Normal Thyroid:  Symmetrical, normal in size, without palpable masses or nodularity. Respiratory  Auscultation:  Clear without wheezing or rhonchi Cardiovascular  Auscultation:  Regular rate, without rubs, murmurs or gallops  Edema/varicosities:  Not grossly evident Abdominal  Soft,nontender, without masses, guarding or rebound.  Liver/spleen:  No organomegaly noted  Hernia:  None appreciated  Skin  Inspection:  Grossly normal Breasts: Examined lying and sitting.   Right: Without masses, retractions, nipple discharge or axillary adenopathy.   Left: Without masses, retractions, nipple discharge or axillary adenopathy. Pelvic: External genitalia:  no lesions              Urethra:  normal appearing urethra with no masses, tenderness or lesions              Bartholins and Skenes: normal                 Vagina: normal appearing vagina with normal color and discharge, no lesions              Cervix: no  lesions Bimanual Exam:  Uterus:  no masses or tenderness              Adnexa: no mass, fullness, tenderness              Rectovaginal: Deferred              Anus:  normal, no lesions  Patient informed chaperone available to be present for breast and pelvic exam. Patient has requested no chaperone to be present. Patient has been advised what will be completed during breast and pelvic exam.   Assessment/Plan:  47 y.o. G1P1001 for annual exam.   Well female exam with routine gynecological exam - Education provided on SBEs, importance of preventative screenings, current guidelines, high calcium diet, regular exercise, and multivitamin daily.  Labs with PCP.   Encounter for initial prescription of contraceptive pills - Plan: norethindrone-ethinyl estradiol-FE (JUNEL FE 1/20) 1-20 MG-MCG tablet continuously. Taking as prescribed. Refill x 1 year provided.   Screening for colon cancer - Plan: Cologuard. Cologuard versus colonoscopy discussed. No personal risk factors or family history of colon cancer. Interested in [employer].   Screening for cervical cancer - 2004 LEEP.  Will repeat at 3-year interval per guidelines.   Screening for breast cancer - Normal mammogram history.  Continue annual screenings. Normal breast exam today.  Return in about 1 year (around 09/19/2024) for Annual.     Olivia Mackie DNP, 2:52 PM 09/20/2023

## 2023-10-02 ENCOUNTER — Other Ambulatory Visit (HOSPITAL_COMMUNITY): Payer: Self-pay

## 2023-10-09 ENCOUNTER — Other Ambulatory Visit (HOSPITAL_COMMUNITY): Payer: Self-pay

## 2023-10-09 ENCOUNTER — Other Ambulatory Visit: Payer: Self-pay | Admitting: Nurse Practitioner

## 2023-10-09 DIAGNOSIS — G43E19 Chronic migraine with aura, intractable, without status migrainosus: Secondary | ICD-10-CM

## 2023-10-09 NOTE — Telephone Encounter (Signed)
Hey April. This was sent to me and I am no longer her PCP. Can you get Lequita Halt or Dr. Corky Downs to fill this? Please and thank you.

## 2023-10-19 ENCOUNTER — Other Ambulatory Visit (HOSPITAL_COMMUNITY): Payer: Self-pay

## 2023-10-22 ENCOUNTER — Telehealth: Payer: Self-pay

## 2023-10-22 ENCOUNTER — Other Ambulatory Visit: Payer: Self-pay | Admitting: Nurse Practitioner

## 2023-10-22 ENCOUNTER — Encounter (HOSPITAL_COMMUNITY): Payer: Self-pay

## 2023-10-22 ENCOUNTER — Other Ambulatory Visit (HOSPITAL_COMMUNITY): Payer: Self-pay

## 2023-10-22 DIAGNOSIS — G43E19 Chronic migraine with aura, intractable, without status migrainosus: Secondary | ICD-10-CM

## 2023-10-22 DIAGNOSIS — F411 Generalized anxiety disorder: Secondary | ICD-10-CM

## 2023-10-22 MED ORDER — ALPRAZOLAM 1 MG PO TABS
0.5000 mg | ORAL_TABLET | ORAL | 0 refills | Status: DC | PRN
  Filled 2023-10-22: qty 7, 7d supply, fill #0

## 2023-10-22 MED ORDER — AMITRIPTYLINE HCL 50 MG PO TABS
50.0000 mg | ORAL_TABLET | Freq: Every day | ORAL | 0 refills | Status: DC
  Filled 2023-10-22: qty 30, 30d supply, fill #0

## 2023-10-22 NOTE — Addendum Note (Signed)
Addended by: Saralyn Pilar on: 10/22/2023 04:37 PM   Modules accepted: Orders

## 2023-10-22 NOTE — Telephone Encounter (Signed)
Prescription Request  10/22/2023  LOV: 03/08/23  What is the name of the medication or equipment?  ALPRAZolam (XANAX) 1 MG tablet  amitriptyline (ELAVIL) 50 MG tablet   Have you contacted your pharmacy to request a refill? Yes   Which pharmacy would you like this sent to?  [place] [facility] [facility] [address] [place] [address] Phone: [phone] Fax: [phone]  Patient notified that their request is being sent to the clinical staff for review and that they should receive a response within 2 business days.   Please advise at Mobile [phone] (mobile)  Pt scheduled an appt for the first available 11/09/23

## 2023-10-23 ENCOUNTER — Other Ambulatory Visit (HOSPITAL_COMMUNITY): Payer: Self-pay

## 2023-11-15 ENCOUNTER — Other Ambulatory Visit: Payer: Self-pay | Admitting: Family Medicine

## 2023-11-15 ENCOUNTER — Other Ambulatory Visit (HOSPITAL_COMMUNITY): Payer: Self-pay

## 2023-11-15 DIAGNOSIS — F411 Generalized anxiety disorder: Secondary | ICD-10-CM

## 2023-11-15 DIAGNOSIS — G43E19 Chronic migraine with aura, intractable, without status migrainosus: Secondary | ICD-10-CM

## 2023-11-15 MED ORDER — ALPRAZOLAM 1 MG PO TABS
0.5000 mg | ORAL_TABLET | ORAL | 0 refills | Status: DC | PRN
  Filled 2023-11-15 – 2023-11-16 (×2): qty 7, 7d supply, fill #0

## 2023-11-15 MED ORDER — AMITRIPTYLINE HCL 50 MG PO TABS
50.0000 mg | ORAL_TABLET | Freq: Every day | ORAL | 0 refills | Status: DC
  Filled 2023-11-15: qty 30, 30d supply, fill #0
  Filled 2023-11-16: qty 11, 11d supply, fill #0
  Filled 2023-11-16: qty 19, 19d supply, fill #0

## 2023-11-16 ENCOUNTER — Other Ambulatory Visit (HOSPITAL_COMMUNITY): Payer: Self-pay

## 2023-11-16 ENCOUNTER — Other Ambulatory Visit: Payer: Self-pay

## 2023-11-22 ENCOUNTER — Other Ambulatory Visit (HOSPITAL_COMMUNITY): Payer: Self-pay

## 2023-11-22 ENCOUNTER — Other Ambulatory Visit: Payer: Self-pay | Admitting: Nurse Practitioner

## 2023-11-22 DIAGNOSIS — Z3041 Encounter for surveillance of contraceptive pills: Secondary | ICD-10-CM

## 2023-11-22 NOTE — Telephone Encounter (Signed)
 Med refill request: Blisovi  1/20 FE #112 from [facility] Last AEX: 09/20/23 Next AEX: none scheduled Last MMG (if hormonal med) 05/07/23 RX was sent 09/20/23 to [facility] pharmacy for Blisovi  FE 1/20 #112 with 3 refills.  LM for patient to call back to check on RF request from [facility] and to see if patient was changing pharmacies.

## 2023-12-06 ENCOUNTER — Other Ambulatory Visit: Payer: Self-pay | Admitting: Family Medicine

## 2023-12-06 ENCOUNTER — Ambulatory Visit: Payer: Self-pay

## 2023-12-06 DIAGNOSIS — M25512 Pain in left shoulder: Secondary | ICD-10-CM

## 2023-12-06 DIAGNOSIS — M542 Cervicalgia: Secondary | ICD-10-CM

## 2023-12-11 ENCOUNTER — Other Ambulatory Visit: Payer: Self-pay

## 2023-12-11 ENCOUNTER — Encounter: Payer: Self-pay | Admitting: Family Medicine

## 2023-12-11 ENCOUNTER — Other Ambulatory Visit (HOSPITAL_COMMUNITY): Payer: Self-pay

## 2023-12-11 ENCOUNTER — Ambulatory Visit (INDEPENDENT_AMBULATORY_CARE_PROVIDER_SITE_OTHER): Payer: BC Managed Care – PPO | Admitting: Family Medicine

## 2023-12-11 VITALS — BP 106/68 | HR 75 | Ht 59.45 in | Wt 181.4 lb

## 2023-12-11 DIAGNOSIS — Z6836 Body mass index (BMI) 36.0-36.9, adult: Secondary | ICD-10-CM

## 2023-12-11 DIAGNOSIS — G43909 Migraine, unspecified, not intractable, without status migrainosus: Secondary | ICD-10-CM

## 2023-12-11 DIAGNOSIS — K219 Gastro-esophageal reflux disease without esophagitis: Secondary | ICD-10-CM

## 2023-12-11 DIAGNOSIS — F411 Generalized anxiety disorder: Secondary | ICD-10-CM

## 2023-12-11 DIAGNOSIS — G43E19 Chronic migraine with aura, intractable, without status migrainosus: Secondary | ICD-10-CM | POA: Diagnosis not present

## 2023-12-11 DIAGNOSIS — G4700 Insomnia, unspecified: Secondary | ICD-10-CM | POA: Insufficient documentation

## 2023-12-11 MED ORDER — PROCHLORPERAZINE MALEATE 10 MG PO TABS
10.0000 mg | ORAL_TABLET | ORAL | 0 refills | Status: DC | PRN
  Filled 2023-12-11: qty 30, 10d supply, fill #0

## 2023-12-11 MED ORDER — TRAZODONE HCL 50 MG PO TABS
50.0000 mg | ORAL_TABLET | Freq: Every day | ORAL | 1 refills | Status: DC
  Filled 2023-12-11: qty 90, 30d supply, fill #0
  Filled 2024-02-01: qty 90, 30d supply, fill #1

## 2023-12-11 MED ORDER — ALPRAZOLAM 1 MG PO TABS
0.5000 mg | ORAL_TABLET | ORAL | 0 refills | Status: DC | PRN
  Filled 2023-12-11: qty 30, 30d supply, fill #0

## 2023-12-11 MED ORDER — IBUPROFEN 800 MG PO TABS
800.0000 mg | ORAL_TABLET | Freq: Three times a day (TID) | ORAL | 1 refills | Status: DC | PRN
  Filled 2023-12-11: qty 90, 30d supply, fill #0

## 2023-12-11 MED ORDER — ZEPBOUND 2.5 MG/0.5ML ~~LOC~~ SOAJ
2.5000 mg | SUBCUTANEOUS | 1 refills | Status: DC
  Filled 2023-12-11 – 2023-12-18 (×3): qty 2, 28d supply, fill #0
  Filled 2024-01-16: qty 2, 28d supply, fill #1

## 2023-12-11 MED ORDER — OMEPRAZOLE 20 MG PO CPDR
20.0000 mg | DELAYED_RELEASE_CAPSULE | Freq: Every day | ORAL | 0 refills | Status: DC
  Filled 2023-12-11: qty 30, 30d supply, fill #0
  Filled 2024-03-01: qty 30, 30d supply, fill #1

## 2023-12-11 MED ORDER — SUMATRIPTAN SUCCINATE 100 MG PO TABS
ORAL_TABLET | ORAL | 3 refills | Status: DC
  Filled 2023-12-11: qty 9, 30d supply, fill #0
  Filled 2023-12-13: qty 9, 9d supply, fill #0
  Filled 2024-03-01: qty 9, 30d supply, fill #0

## 2023-12-11 MED ORDER — AMITRIPTYLINE HCL 75 MG PO TABS
75.0000 mg | ORAL_TABLET | Freq: Every day | ORAL | 1 refills | Status: DC
  Filled 2023-12-11: qty 30, 30d supply, fill #0
  Filled 2024-01-16: qty 30, 30d supply, fill #1
  Filled 2024-02-06 – 2024-02-08 (×2): qty 30, 30d supply, fill #2

## 2023-12-11 NOTE — Assessment & Plan Note (Signed)
Refill sent for Xanax 0.5-1 mg on an as-needed basis.  We discussed that ideally, the trazodone or a different sleep medication will replace the Xanax as Xanax is not recommended for long-term frequent use.  Patient verbalized understanding and is agreeable to this plan.

## 2023-12-11 NOTE — Progress Notes (Signed)
Established Patient Office Visit  Subjective   Patient ID: Phyllis Morales, female    DOB: [birth year]  Age: 48 y.o. MRN: [id number]  Chief Complaint  Patient presents with   Medication Refill    HPI DIEGO DELANCEY is a 48 y.o. female presenting today for follow up of chronic issues.  She is still getting migraines about 3 times a week the low-dose of amitriptyline.  Sumatriptan generally works well to abort a migraine, but she does endorse significant nausea for 1-2 hours even when taking the sumatriptan.  She would also like to discuss options for sleep so that she does not need to take Xanax every night.  Today, she is interested in weight management and believes that her insurance will cover Zepbound says she called them to inquire about this not long ago.  Outpatient Medications Prior to Visit  Medication Sig   [DISCONTINUED] ALPRAZolam (XANAX) 1 MG tablet Take 1/2-1 tablet (0.5-1 mg) by mouth as needed (BREAKTHROUGH ANXIETY).   [DISCONTINUED] amitriptyline (ELAVIL) 50 MG tablet Take 1 tablet (50 mg total) by mouth at bedtime.   [DISCONTINUED] amphetamine-dextroamphetamine (ADDERALL) 20 MG tablet Take 20 mg by mouth 3 (three) times daily.   [DISCONTINUED] escitalopram (LEXAPRO) 10 MG tablet Take 10 mg by mouth daily.   [DISCONTINUED] ibuprofen (ADVIL) 800 MG tablet Take 1 tablet (800 mg total) by mouth 3 (three) times daily as needed.   [DISCONTINUED] omeprazole (PRILOSEC) 20 MG capsule Take 1 capsule (20 mg total) by mouth daily.   [DISCONTINUED] SUMAtriptan (IMITREX) 100 MG tablet Take 1 tablet by mouth once as needed for acute migraine. May repeat dose in 2 hours for persistent migraine.   [DISCONTINUED] BLISOVI FE 1/20 1-20 MG-MCG tablet TAKE ONE TABLET BY MOUTH DAILY. TAKE ACTIVE PILLS ONLY. SKIPPING PLACEBO PILLS   [DISCONTINUED] tirzepatide (MOUNJARO) 2.5 MG/0.5ML Pen Inject 2.5 mg into the skin once a week. (Patient not taking: Reported on 09/20/2023)   [DISCONTINUED]  traZODone (DESYREL) 50 MG tablet Take 50-150 mg by mouth at bedtime.   No facility-administered medications prior to visit.    ROS Negative unless otherwise noted in HPI   Objective:     BP 106/68   Pulse 75   Ht 4' 11.45" (1.51 m)   Wt 181 lb 6.4 oz (82.3 kg)   SpO2 100%   BMI 36.09 kg/m   Physical Exam Constitutional:      General: She is not in acute distress.    Appearance: Normal appearance.  HENT:     Head: Normocephalic and atraumatic.  Pulmonary:     Effort: Pulmonary effort is normal. No respiratory distress.  Musculoskeletal:     Cervical back: Normal range of motion.  Neurological:     General: No focal deficit present.     Mental Status: She is alert and oriented to person, place, and time. Mental status is at baseline.  Psychiatric:        Mood and Affect: Mood normal.        Thought Content: Thought content normal.        Judgment: Judgment normal.      Assessment & Plan:  Intractable chronic migraine with aura and without status migrainosus Assessment & Plan: Increase amitriptyline dose to 75 mg daily as prevention for migraine.  Maximum dose is 150 mg daily.  If this is ineffective, we may consider switching to a different medication like Nurtec every other day.  Will continue to monitor.  Orders: -  Amitriptyline HCl; Take 1 tablet (75 mg total) by mouth at bedtime.  Dispense: 90 tablet; Refill: 1  Acute migraine Assessment & Plan: Continue sumatriptan 100 mg as needed as abortive therapy for acute migraine.  May also take prochlorperazine for nausea associated with migraine.  Orders: -     SUMAtriptan Succinate; Take 1 tablet by mouth once as needed for acute migraine. May repeat dose in 2 hours for persistent migraine.  Dispense: 9 tablet; Refill: 3 -     Ibuprofen; Take 1 tablet (800 mg total) by mouth 3 (three) times daily as needed.  Dispense: 90 tablet; Refill: 1 -     Prochlorperazine Maleate; Take 1 tablet (10 mg total) by mouth as  needed for nausea or vomiting. With migraine.  Dispense: 30 tablet; Refill: 0  Generalized anxiety disorder Assessment & Plan: Refill sent for Xanax 0.5-1 mg on an as-needed basis.  We discussed that ideally, the trazodone or a different sleep medication will replace the Xanax as Xanax is not recommended for long-term frequent use.  Patient verbalized understanding and is agreeable to this plan.  Orders: -     ALPRAZolam; Take 1/2-1 tablet (0.5-1 mg) by mouth as needed (BREAKTHROUGH ANXIETY).  Dispense: 30 tablet; Refill: 0  Gastroesophageal reflux disease without esophagitis -     Omeprazole; Take 1 capsule (20 mg total) by mouth daily.  Dispense: 90 capsule; Refill: 0  Insomnia, unspecified type Assessment & Plan: Agreeable to trial of restarting trazodone which she was previously using for sleep.  May take trazodone 50-150 mg daily at bedtime.  Will continue to monitor.  Orders: -     traZODone HCl; Take 1-3 tablets (50-150 mg total) by mouth at bedtime.  Dispense: 90 tablet; Refill: 1  BMI 36.0-36.9,adult Assessment & Plan: Start Zepbound 2.5 mg weekly in conjunction with nutrition and physical activity efforts to lose weight.  Orders: -     Zepbound; Inject 2.5 mg into the skin once a week.  Dispense: 2 mL; Refill: 1    Return in about 2 months (around 02/08/2024) for follow-up for sleep, weight management.    Melida Quitter, PA

## 2023-12-11 NOTE — Assessment & Plan Note (Signed)
Continue sumatriptan 100 mg as needed as abortive therapy for acute migraine.  May also take prochlorperazine for nausea associated with migraine.

## 2023-12-11 NOTE — Assessment & Plan Note (Signed)
Increase amitriptyline dose to 75 mg daily as prevention for migraine.  Maximum dose is 150 mg daily.  If this is ineffective, we may consider switching to a different medication like Nurtec every other day.  Will continue to monitor.

## 2023-12-11 NOTE — Assessment & Plan Note (Signed)
Agreeable to trial of restarting trazodone which she was previously using for sleep.  May take trazodone 50-150 mg daily at bedtime.  Will continue to monitor.

## 2023-12-11 NOTE — Assessment & Plan Note (Signed)
Start Zepbound 2.5 mg weekly in conjunction with nutrition and physical activity efforts to lose weight.

## 2023-12-11 NOTE — Patient Instructions (Signed)
INFORMATION: Podcast: The Obesity Guide with Matthea Rentea, check out the episodes about medication options!  Nutrition: Leane Call [email or web address]  TikTok:b https://www.tiktok.com/@simplyhealthyrd ?lang=en [email or web address]  Instagram: [email or web address]/ [email or web address]  Website: [email or web address] 

## 2023-12-13 ENCOUNTER — Other Ambulatory Visit (HOSPITAL_BASED_OUTPATIENT_CLINIC_OR_DEPARTMENT_OTHER): Payer: Self-pay

## 2023-12-13 ENCOUNTER — Other Ambulatory Visit (HOSPITAL_COMMUNITY): Payer: Self-pay

## 2023-12-13 MED ORDER — NORETHIN ACE-ETH ESTRAD-FE 1-20 MG-MCG PO TABS
1.0000 | ORAL_TABLET | Freq: Every day | ORAL | 3 refills | Status: DC
  Filled 2023-12-13: qty 28, 21d supply, fill #0
  Filled 2024-01-01: qty 28, 21d supply, fill #1
  Filled 2024-01-17: qty 28, 21d supply, fill #2
  Filled 2024-02-08: qty 28, 21d supply, fill #3
  Filled 2024-03-01: qty 28, 21d supply, fill #4

## 2023-12-14 ENCOUNTER — Other Ambulatory Visit: Payer: BC Managed Care – PPO

## 2023-12-14 ENCOUNTER — Other Ambulatory Visit: Payer: Self-pay

## 2023-12-14 DIAGNOSIS — Z1159 Encounter for screening for other viral diseases: Secondary | ICD-10-CM | POA: Diagnosis not present

## 2023-12-14 DIAGNOSIS — Z6836 Body mass index (BMI) 36.0-36.9, adult: Secondary | ICD-10-CM | POA: Diagnosis not present

## 2023-12-14 NOTE — Telephone Encounter (Signed)
RX refill request on RX line--patient requested to use [facility] for RF on Blisovi FE 1/20.  VM left for patient that RX was sent 11/23/23 to [facility] with refills.  Patient was given phone number to pharmacy to have prescription transferred to [facility].  Patient was instructed to call back if she had any questions.

## 2023-12-15 LAB — COMPREHENSIVE METABOLIC PANEL
ALT: 9 [IU]/L (ref 0–32)
AST: 10 [IU]/L (ref 0–40)
Albumin: 4.2 g/dL (ref 3.9–4.9)
Alkaline Phosphatase: 88 [IU]/L (ref 44–121)
BUN/Creatinine Ratio: 9 (ref 9–23)
BUN: 8 mg/dL (ref 6–24)
Bilirubin Total: 0.2 mg/dL (ref 0.0–1.2)
CO2: 22 mmol/L (ref 20–29)
Calcium: 9.2 mg/dL (ref 8.7–10.2)
Chloride: 102 mmol/L (ref 96–106)
Creatinine, Ser: 0.9 mg/dL (ref 0.57–1.00)
Globulin, Total: 2.6 g/dL (ref 1.5–4.5)
Glucose: 84 mg/dL (ref 70–99)
Potassium: 4.4 mmol/L (ref 3.5–5.2)
Sodium: 140 mmol/L (ref 134–144)
Total Protein: 6.8 g/dL (ref 6.0–8.5)
eGFR: 78 mL/min/{1.73_m2} (ref >59)

## 2023-12-15 LAB — CBC WITH DIFFERENTIAL/PLATELET
Basophils Absolute: 0 10*3/uL (ref 0.0–0.2)
Basos: 1 %
EOS (ABSOLUTE): 0.1 10*3/uL (ref 0.0–0.4)
Eos: 1 %
Hematocrit: 42 % (ref 34.0–46.6)
Hemoglobin: 13.9 g/dL (ref 11.1–15.9)
Immature Grans (Abs): 0 10*3/uL (ref 0.0–0.1)
Immature Granulocytes: 0 %
Lymphocytes Absolute: 1.7 10*3/uL (ref 0.7–3.1)
Lymphs: 27 %
MCH: 30.3 pg (ref 26.6–33.0)
MCHC: 33.1 g/dL (ref 31.5–35.7)
MCV: 92 fL (ref 79–97)
Monocytes Absolute: 0.4 10*3/uL (ref 0.1–0.9)
Monocytes: 7 %
Neutrophils Absolute: 4 10*3/uL (ref 1.4–7.0)
Neutrophils: 64 %
Platelets: 325 10*3/uL (ref 150–450)
RBC: 4.58 x10E6/uL (ref 3.77–5.28)
RDW: 12.8 % (ref 11.7–15.4)
WBC: 6.1 10*3/uL (ref 3.4–10.8)

## 2023-12-15 LAB — VITAMIN D 25 HYDROXY (VIT D DEFICIENCY, FRACTURES): Vit D, 25-Hydroxy: 41.8 ng/mL (ref 30.0–100.0)

## 2023-12-15 LAB — HEMOGLOBIN A1C
Est. average glucose Bld gHb Est-mCnc: 137 mg/dL
Hgb A1c MFr Bld: 6.4 % — ABNORMAL HIGH (ref 4.8–5.6)

## 2023-12-15 LAB — LIPID PANEL
Chol/HDL Ratio: 5.2 {ratio} — ABNORMAL HIGH (ref 0.0–4.4)
Cholesterol, Total: 224 mg/dL — ABNORMAL HIGH (ref 100–199)
HDL: 43 mg/dL (ref >39)
LDL Chol Calc (NIH): 154 mg/dL — ABNORMAL HIGH (ref 0–99)
Triglycerides: 148 mg/dL (ref 0–149)
VLDL Cholesterol Cal: 27 mg/dL (ref 5–40)

## 2023-12-15 LAB — HIV ANTIBODY (ROUTINE TESTING W REFLEX): HIV Screen 4th Generation wRfx: NONREACTIVE

## 2023-12-15 LAB — HEPATITIS C ANTIBODY: Hep C Virus Ab: NONREACTIVE

## 2023-12-15 LAB — TSH RFX ON ABNORMAL TO FREE T4: TSH: 1.42 u[IU]/mL (ref 0.450–4.500)

## 2023-12-17 ENCOUNTER — Encounter: Payer: Self-pay | Admitting: Family Medicine

## 2023-12-18 ENCOUNTER — Other Ambulatory Visit (HOSPITAL_COMMUNITY): Payer: Self-pay

## 2023-12-19 ENCOUNTER — Other Ambulatory Visit (HOSPITAL_COMMUNITY): Payer: Self-pay

## 2023-12-20 ENCOUNTER — Encounter: Payer: Self-pay | Admitting: Family Medicine

## 2024-01-01 ENCOUNTER — Other Ambulatory Visit (HOSPITAL_COMMUNITY): Payer: Self-pay

## 2024-01-03 ENCOUNTER — Other Ambulatory Visit: Payer: Self-pay

## 2024-01-03 ENCOUNTER — Other Ambulatory Visit (HOSPITAL_COMMUNITY): Payer: Self-pay

## 2024-01-11 ENCOUNTER — Other Ambulatory Visit: Payer: Self-pay

## 2024-01-11 MED ORDER — PREDNISONE 10 MG PO TABS
ORAL_TABLET | ORAL | 0 refills | Status: AC
  Filled 2024-01-11: qty 21, 15d supply, fill #0
  Filled 2024-01-16: qty 21, 10d supply, fill #0

## 2024-01-16 ENCOUNTER — Telehealth: Payer: Self-pay | Admitting: *Deleted

## 2024-01-16 ENCOUNTER — Other Ambulatory Visit: Payer: Self-pay

## 2024-01-16 ENCOUNTER — Other Ambulatory Visit: Payer: Self-pay | Admitting: Family Medicine

## 2024-01-16 ENCOUNTER — Other Ambulatory Visit (HOSPITAL_COMMUNITY): Payer: Self-pay

## 2024-01-16 DIAGNOSIS — Z6836 Body mass index (BMI) 36.0-36.9, adult: Secondary | ICD-10-CM

## 2024-01-16 NOTE — Telephone Encounter (Unsigned)
 Copied from CRM [id number]. Topic: Clinical - Prescription Issue >> Jan 16, 2024 11:46 AM Epimenio Foot F wrote: Reason for CRM: Patient is calling in because she tried to refill her Zepbound, but the dosage is incorrect. Patient says she is supposed to be moved to the 5 MG dose. Patient says the pharmacy can fill it in an hour if the medication is sent over.

## 2024-01-17 ENCOUNTER — Encounter (HOSPITAL_COMMUNITY): Payer: Self-pay

## 2024-01-17 ENCOUNTER — Other Ambulatory Visit (HOSPITAL_COMMUNITY): Payer: Self-pay

## 2024-01-17 ENCOUNTER — Encounter: Payer: Self-pay | Admitting: Family Medicine

## 2024-01-17 ENCOUNTER — Other Ambulatory Visit: Payer: Self-pay

## 2024-01-17 MED ORDER — TIRZEPATIDE-WEIGHT MANAGEMENT 5 MG/0.5ML ~~LOC~~ SOAJ
5.0000 mg | SUBCUTANEOUS | 2 refills | Status: DC
  Filled 2024-01-17: qty 2, 28d supply, fill #0
  Filled 2024-02-06: qty 2, 28d supply, fill #1
  Filled ????-??-??: fill #1

## 2024-01-17 NOTE — Telephone Encounter (Signed)
5mg dose sent to pharmacy 

## 2024-01-22 ENCOUNTER — Other Ambulatory Visit: Payer: Self-pay

## 2024-02-01 ENCOUNTER — Other Ambulatory Visit (HOSPITAL_COMMUNITY): Payer: Self-pay

## 2024-02-06 ENCOUNTER — Other Ambulatory Visit (HOSPITAL_COMMUNITY): Payer: Self-pay

## 2024-02-06 ENCOUNTER — Other Ambulatory Visit: Payer: Self-pay | Admitting: Family Medicine

## 2024-02-06 DIAGNOSIS — F411 Generalized anxiety disorder: Secondary | ICD-10-CM

## 2024-02-06 DIAGNOSIS — Z6836 Body mass index (BMI) 36.0-36.9, adult: Secondary | ICD-10-CM

## 2024-02-06 MED ORDER — TIRZEPATIDE 7.5 MG/0.5ML ~~LOC~~ SOAJ
7.5000 mg | SUBCUTANEOUS | 1 refills | Status: DC
  Filled 2024-02-06 – 2024-02-13 (×5): qty 2, 28d supply, fill #0

## 2024-02-06 MED ORDER — ALPRAZOLAM 1 MG PO TABS
0.5000 mg | ORAL_TABLET | ORAL | 0 refills | Status: DC | PRN
  Filled 2024-02-06: qty 30, 30d supply, fill #0

## 2024-02-06 NOTE — Addendum Note (Signed)
 Addended by: Joycelyn Man RUMPLE, Moussa Wiegand D on: 02/06/2024 08:30 AM   Modules accepted: Orders

## 2024-02-06 NOTE — Telephone Encounter (Signed)
 Copied from CRM [id number]. Topic: Clinical - Medication Refill >> Feb 06, 2024 11:54 AM Carlatta H wrote: Most Recent Primary Care Visit:  Provider: [facility]  Department: [facility]  Visit Type: LAB VISIT  Date: 12/14/2023  Medication: tirzepatide (ZEPBOUND) 7.5 MG/0.5ML Pen [[id number]] ALPRAZolam (XANAX) 1 MG tablet [[id number]]  Has the patient contacted their pharmacy? Yes (Agent: If no, request that the patient contact the pharmacy for the refill. If patient does not wish to contact the pharmacy document the reason why and proceed with request.) (Agent: If yes, when and what did the pharmacy advise?) Advised to call to get next strength up  Is this the correct pharmacy for this prescription? Yes If no, delete pharmacy and type the correct one.  This is the patient's preferred pharmacy:  [facility] [address] [place] [address] Phone: [phone] Fax: [phone]     Has the prescription been filled recently? No  Is the patient out of the medication? Yes  Has the patient been seen for an appointment in the last year OR does the patient have an upcoming appointment? Yes  Can we respond through MyChart? Yes  Agent: Please be advised that Rx refills may take up to 3 business days. We ask that you follow-up with your pharmacy.

## 2024-02-07 ENCOUNTER — Other Ambulatory Visit: Payer: Self-pay

## 2024-02-07 ENCOUNTER — Other Ambulatory Visit (HOSPITAL_COMMUNITY): Payer: Self-pay

## 2024-02-08 ENCOUNTER — Other Ambulatory Visit (HOSPITAL_COMMUNITY): Payer: Self-pay

## 2024-02-08 ENCOUNTER — Other Ambulatory Visit: Payer: Self-pay

## 2024-02-11 ENCOUNTER — Ambulatory Visit: Payer: BC Managed Care – PPO | Admitting: Family Medicine

## 2024-02-11 ENCOUNTER — Encounter: Payer: Self-pay | Admitting: Pharmacist

## 2024-02-11 ENCOUNTER — Telehealth: Payer: Self-pay

## 2024-02-11 ENCOUNTER — Other Ambulatory Visit: Payer: Self-pay

## 2024-02-11 NOTE — Telephone Encounter (Signed)
 Copied from CRM [id number]. Topic: Clinical - Prescription Issue >> Feb 11, 2024 11:20 AM Phyllis Morales wrote: Reason for CRM: The patient would like to please follow up with a member of clinical staff when possible on the previously requested increase of their tirzepatide [facility]) prescription

## 2024-02-12 ENCOUNTER — Other Ambulatory Visit (HOSPITAL_COMMUNITY): Payer: Self-pay

## 2024-02-13 ENCOUNTER — Other Ambulatory Visit (HOSPITAL_COMMUNITY): Payer: Self-pay

## 2024-02-13 ENCOUNTER — Other Ambulatory Visit: Payer: Self-pay | Admitting: Family Medicine

## 2024-02-13 DIAGNOSIS — Z6836 Body mass index (BMI) 36.0-36.9, adult: Secondary | ICD-10-CM

## 2024-02-14 ENCOUNTER — Other Ambulatory Visit: Payer: Self-pay | Admitting: Family Medicine

## 2024-02-14 ENCOUNTER — Other Ambulatory Visit (HOSPITAL_COMMUNITY): Payer: Self-pay

## 2024-02-14 ENCOUNTER — Telehealth: Payer: Self-pay | Admitting: *Deleted

## 2024-02-14 ENCOUNTER — Encounter (HOSPITAL_COMMUNITY): Payer: Self-pay

## 2024-02-14 ENCOUNTER — Other Ambulatory Visit: Payer: Self-pay

## 2024-02-14 MED ORDER — ZEPBOUND 7.5 MG/0.5ML ~~LOC~~ SOAJ
7.5000 mg | SUBCUTANEOUS | 2 refills | Status: DC
  Filled 2024-02-14: qty 2, 28d supply, fill #0
  Filled 2024-03-04 – 2024-03-07 (×3): qty 2, 28d supply, fill #1

## 2024-02-14 NOTE — Telephone Encounter (Signed)
 Pt called to say that she needs a refill of the 7.5 Zepbound sent to the pharmacy.  Whoever took the original message in reference to refill put mounjaro as the medicine.  Pt states she has not taken mounjaro.  She said she last took th 5 of Zepbound and it is time to titrate up.

## 2024-02-14 NOTE — Telephone Encounter (Signed)
 Copied from CRM [id number]. Topic: General - Other >> Feb 14, 2024 12:56 PM Phyllis Morales wrote: Reason for CRM: The patient requested to be transferred to [facility] Phyllis Morales as she spoke to her this morning. I attempted to call the office. However, they were unavailable. Her call back number is [phone].

## 2024-02-14 NOTE — Telephone Encounter (Signed)
 Contacted pt and informed her that the updated prescription has been sent. Recommended she confirm with them before she goes over there.

## 2024-02-15 ENCOUNTER — Encounter (HOSPITAL_COMMUNITY): Payer: Self-pay

## 2024-02-15 ENCOUNTER — Other Ambulatory Visit (HOSPITAL_COMMUNITY): Payer: Self-pay

## 2024-03-01 ENCOUNTER — Other Ambulatory Visit (HOSPITAL_COMMUNITY): Payer: Self-pay

## 2024-03-01 ENCOUNTER — Other Ambulatory Visit: Payer: Self-pay | Admitting: Family Medicine

## 2024-03-01 ENCOUNTER — Other Ambulatory Visit (HOSPITAL_BASED_OUTPATIENT_CLINIC_OR_DEPARTMENT_OTHER): Payer: Self-pay

## 2024-03-01 DIAGNOSIS — G4700 Insomnia, unspecified: Secondary | ICD-10-CM

## 2024-03-03 ENCOUNTER — Other Ambulatory Visit: Payer: Self-pay

## 2024-03-04 ENCOUNTER — Other Ambulatory Visit (HOSPITAL_COMMUNITY): Payer: Self-pay

## 2024-03-04 MED ORDER — TRAZODONE HCL 50 MG PO TABS
50.0000 mg | ORAL_TABLET | Freq: Every day | ORAL | 1 refills | Status: DC
  Filled 2024-03-04: qty 90, 30d supply, fill #0

## 2024-03-06 ENCOUNTER — Other Ambulatory Visit (HOSPITAL_COMMUNITY): Payer: Self-pay

## 2024-03-07 ENCOUNTER — Other Ambulatory Visit (HOSPITAL_COMMUNITY): Payer: Self-pay

## 2024-03-11 ENCOUNTER — Other Ambulatory Visit (HOSPITAL_COMMUNITY): Payer: Self-pay

## 2024-03-12 ENCOUNTER — Other Ambulatory Visit (HOSPITAL_COMMUNITY): Payer: Self-pay

## 2024-03-27 ENCOUNTER — Other Ambulatory Visit (HOSPITAL_COMMUNITY): Payer: Self-pay

## 2024-03-28 ENCOUNTER — Other Ambulatory Visit: Payer: Self-pay

## 2024-03-28 ENCOUNTER — Other Ambulatory Visit: Payer: Self-pay | Admitting: Family Medicine

## 2024-03-28 ENCOUNTER — Other Ambulatory Visit (HOSPITAL_COMMUNITY): Payer: Self-pay

## 2024-03-28 ENCOUNTER — Telehealth: Payer: Self-pay | Admitting: *Deleted

## 2024-03-28 MED ORDER — TIRZEPATIDE-WEIGHT MANAGEMENT 10 MG/0.5ML ~~LOC~~ SOAJ
10.0000 mg | SUBCUTANEOUS | 2 refills | Status: DC
  Filled 2024-03-28: qty 2, 28d supply, fill #0
  Filled 2024-05-08: qty 2, 28d supply, fill #1

## 2024-03-28 MED ORDER — CELECOXIB 200 MG PO CAPS
200.0000 mg | ORAL_CAPSULE | Freq: Every day | ORAL | 1 refills | Status: DC
  Filled 2024-03-28 (×2): qty 30, 30d supply, fill #0

## 2024-03-28 NOTE — Telephone Encounter (Signed)
 Copied from CRM [id number]. Topic: Clinical - Medication Question >> Mar 28, 2024  9:57 AM Hobson Luna F wrote: Reason for CRM: Patient is calling in wanting to know if she can have her Zepbound  sent over to the [facility], but sent in the next dose. Patient is currently on the 7 MG and is requesting the next dose up. She is requesting this be sent to pharmacy urgently.

## 2024-03-28 NOTE — Telephone Encounter (Signed)
 Prescription has been sent in

## 2024-03-28 NOTE — Telephone Encounter (Signed)
Pt informed

## 2024-03-31 ENCOUNTER — Other Ambulatory Visit (HOSPITAL_COMMUNITY): Payer: Self-pay

## 2024-04-09 ENCOUNTER — Telehealth: Payer: Self-pay | Admitting: *Deleted

## 2024-04-09 NOTE — Telephone Encounter (Signed)
 Contacted pt and appointment scheduled for tomorrow to get this.

## 2024-04-09 NOTE — Telephone Encounter (Signed)
 Copied from CRM [id number]. Topic: Clinical - Request for Lab/Test Order >> Apr 09, 2024  3:12 PM Baldemar Lev wrote: Reason for CRM: Pt wants TDAP

## 2024-04-10 ENCOUNTER — Ambulatory Visit

## 2024-04-16 ENCOUNTER — Ambulatory Visit (INDEPENDENT_AMBULATORY_CARE_PROVIDER_SITE_OTHER)

## 2024-04-16 VITALS — BP 118/78 | HR 89 | Ht 59.45 in | Wt 180.8 lb

## 2024-04-16 DIAGNOSIS — Z23 Encounter for immunization: Secondary | ICD-10-CM

## 2024-04-16 NOTE — Progress Notes (Signed)
 Pt in office for TDAP, no problems or concerns with vaccine.  Vaccine was administered and VIS was provided.

## 2024-04-29 ENCOUNTER — Other Ambulatory Visit (HOSPITAL_COMMUNITY): Payer: Self-pay

## 2024-05-08 ENCOUNTER — Other Ambulatory Visit: Payer: Self-pay | Admitting: Family Medicine

## 2024-05-08 ENCOUNTER — Other Ambulatory Visit (HOSPITAL_COMMUNITY): Payer: Self-pay

## 2024-05-08 DIAGNOSIS — F411 Generalized anxiety disorder: Secondary | ICD-10-CM

## 2024-05-08 MED ORDER — ALPRAZOLAM 1 MG PO TABS
0.5000 mg | ORAL_TABLET | ORAL | 0 refills | Status: DC | PRN
  Filled 2024-05-08: qty 30, 30d supply, fill #0

## 2024-05-08 NOTE — Telephone Encounter (Signed)
 PDMP reviewed, no aberrancies. Refill appropriate.

## 2024-05-08 NOTE — Telephone Encounter (Signed)
 The patient called in making sure her provider received this refill request. She said she previously called it in last week as she has been out for 2 weeks. I do not see a previous request but do see the one from today. She needs this as soon as possible. Please assist patient further.

## 2024-05-12 DIAGNOSIS — Z1231 Encounter for screening mammogram for malignant neoplasm of breast: Secondary | ICD-10-CM | POA: Diagnosis not present

## 2024-05-12 LAB — HM MAMMOGRAPHY

## 2024-05-23 ENCOUNTER — Ambulatory Visit (INDEPENDENT_AMBULATORY_CARE_PROVIDER_SITE_OTHER): Admitting: Family Medicine

## 2024-05-23 ENCOUNTER — Encounter: Payer: Self-pay | Admitting: Family Medicine

## 2024-05-23 ENCOUNTER — Other Ambulatory Visit (HOSPITAL_COMMUNITY): Payer: Self-pay

## 2024-05-23 VITALS — BP 126/83 | HR 72 | Ht 59.45 in | Wt 177.0 lb

## 2024-05-23 DIAGNOSIS — G43E19 Chronic migraine with aura, intractable, without status migrainosus: Secondary | ICD-10-CM

## 2024-05-23 DIAGNOSIS — Z1211 Encounter for screening for malignant neoplasm of colon: Secondary | ICD-10-CM

## 2024-05-23 DIAGNOSIS — F411 Generalized anxiety disorder: Secondary | ICD-10-CM

## 2024-05-23 DIAGNOSIS — G43909 Migraine, unspecified, not intractable, without status migrainosus: Secondary | ICD-10-CM

## 2024-05-23 DIAGNOSIS — Z1212 Encounter for screening for malignant neoplasm of rectum: Secondary | ICD-10-CM

## 2024-05-23 DIAGNOSIS — Z6836 Body mass index (BMI) 36.0-36.9, adult: Secondary | ICD-10-CM | POA: Diagnosis not present

## 2024-05-23 MED ORDER — AMITRIPTYLINE HCL 75 MG PO TABS
75.0000 mg | ORAL_TABLET | Freq: Every day | ORAL | 1 refills | Status: DC
  Filled 2024-05-23: qty 30, 30d supply, fill #0

## 2024-05-23 MED ORDER — BUSPIRONE HCL 5 MG PO TABS
5.0000 mg | ORAL_TABLET | Freq: Two times a day (BID) | ORAL | 2 refills | Status: DC
  Filled 2024-05-23: qty 60, 30d supply, fill #0

## 2024-05-23 MED ORDER — IBUPROFEN 800 MG PO TABS
800.0000 mg | ORAL_TABLET | Freq: Three times a day (TID) | ORAL | 1 refills | Status: DC | PRN
  Filled 2024-05-23: qty 90, 30d supply, fill #0

## 2024-05-23 MED ORDER — ZEPBOUND 12.5 MG/0.5ML ~~LOC~~ SOAJ
12.5000 mg | SUBCUTANEOUS | 2 refills | Status: DC
Start: 2024-05-23 — End: 2024-08-21
  Filled 2024-05-23 – 2024-05-30 (×2): qty 2, 28d supply, fill #0
  Filled 2024-07-08: qty 2, 28d supply, fill #1
  Filled 2024-07-30: qty 2, 28d supply, fill #2
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
## 2024-05-23 NOTE — Patient Instructions (Signed)
 It was nice to see you today,  We addressed the following topics today: -I am increasing your Zepbound  to 12.5. - The calorie counting app I was discussing is called lose it! I am prescribing a medication called BuSpar  for you to take twice a day for anxiety.  I think this would have the least amount of side effects or interactions with your other medications. - I would also recommend talking to a therapist.  Reach out to your insurance company to see if they have any therapist that they cover. - I sent in the referral for the gastroenterologist.  Have a great day,  Rolan Slain, MD

## 2024-05-23 NOTE — Assessment & Plan Note (Signed)
 current weight 177 lbs, down from 186 lbs on Zepbound  10 mg weekly. Weight loss has been slower than anticipated. Counseled on the importance of combining medication with diet and exercise, emphasizing caloric deficit. Discussed calorie tracking apps (e.g., Lose It) and a high-protein diet. - Increase Zepbound  to 12.5 mg weekly injections. - Follow up in one month to assess for further titration towards the 15 mg maximum dose if needed. - Offered referral to a nutritionist/Healthy Weight and Wellness program.
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
## 2024-05-30 ENCOUNTER — Other Ambulatory Visit (HOSPITAL_COMMUNITY): Payer: Self-pay

## 2024-06-13 ENCOUNTER — Other Ambulatory Visit: Payer: Self-pay | Admitting: Family Medicine

## 2024-06-13 DIAGNOSIS — G4700 Insomnia, unspecified: Secondary | ICD-10-CM

## 2024-06-23 ENCOUNTER — Other Ambulatory Visit: Payer: Self-pay | Admitting: Family Medicine

## 2024-06-23 DIAGNOSIS — G4700 Insomnia, unspecified: Secondary | ICD-10-CM

## 2024-06-23 MED ORDER — TRAZODONE HCL 50 MG PO TABS: ORAL_TABLET | ORAL | 1 refills | Status: DC

## 2024-06-26 ENCOUNTER — Ambulatory Visit (INDEPENDENT_AMBULATORY_CARE_PROVIDER_SITE_OTHER)

## 2024-06-26 VITALS — BP 126/80 | HR 83 | Temp 97.5°F | Ht 59.5 in | Wt 178.0 lb

## 2024-06-26 DIAGNOSIS — G43909 Migraine, unspecified, not intractable, without status migrainosus: Secondary | ICD-10-CM

## 2024-06-26 DIAGNOSIS — Z6836 Body mass index (BMI) 36.0-36.9, adult: Secondary | ICD-10-CM | POA: Diagnosis not present

## 2024-06-26 DIAGNOSIS — F5101 Primary insomnia: Secondary | ICD-10-CM

## 2024-06-26 DIAGNOSIS — F411 Generalized anxiety disorder: Secondary | ICD-10-CM | POA: Diagnosis not present

## 2024-06-26 MED ORDER — BUSPIRONE HCL 10 MG PO TABS: 10.0000 mg | ORAL_TABLET | Freq: Two times a day (BID) | ORAL | 2 refills | Status: DC

## 2024-06-26 MED ORDER — ALPRAZOLAM 1 MG PO TABS: 0.5000 mg | ORAL_TABLET | ORAL | 0 refills | Status: DC | PRN

## 2024-06-26 MED ORDER — IBUPROFEN 800 MG PO TABS: 800.0000 mg | ORAL_TABLET | Freq: Three times a day (TID) | ORAL | 1 refills | Status: AC | PRN

## 2024-06-26 NOTE — Assessment & Plan Note (Signed)
 Current weight: 178 lbs. Down from 186 lbs previously. Obesity managed with medication. Current dose 12.5 mg, considering increase to 15 mg if ineffective. Advised on injection site rotation. - Continue weight management medication at 12.5 mg. - Rotate injection sites. - Consider increasing dose to 15 mg if no improvement, communicate via MyChart.
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
## 2024-06-26 NOTE — Assessment & Plan Note (Signed)
 Insomnia managed with trazodone  and amitriptyline , effective for sleep. Occasional headaches possibly related to neck pain. - Continue trazodone  50-150 mg and amitriptyline  75 mg.

## 2024-06-26 NOTE — Assessment & Plan Note (Signed)
 Stable with amitriptyline  75 mg daily and Sumatriptan  100 mg as needed.
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
## 2024-06-26 NOTE — Patient Instructions (Signed)
 VISIT SUMMARY: Today, we discussed your ongoing anxiety, sleep issues, neck pain, and weight management. We reviewed your current medications and made some adjustments to better manage your symptoms. We also talked about the need for further imaging for your neck pain and the pending referral for a colonoscopy.  YOUR PLAN: -GENERALIZED ANXIETY DISORDER: Generalized anxiety disorder is a condition characterized by persistent and excessive worry about various aspects of life. We will increase your buspirone  dose to 10 mg twice daily and continue using alprazolam  as needed for acute anxiety. Please monitor your response to the increased dose over the next four weeks and communicate any changes via MyChart.  -INSOMNIA: Insomnia is difficulty falling or staying asleep. Your current medications, trazodone  and amitriptyline , are effective, so you should continue taking them as prescribed.  -OBESITY: Obesity is a condition characterized by excessive body fat. You are currently taking a weight management medication at 12.5 mg. Continue with this dose and rotate injection sites. If there is no improvement, we may consider increasing the dose to 15 mg. Please communicate any changes via MyChart.  INSTRUCTIONS: Please follow up in four weeks to review your response to the increased buspirone  dose. Schedule an MRI for your neck pain evaluation. Ensure your colonoscopy referral is scheduled: [phone]   If you have any problems before your next visit feel free to message me via MyChart (minor issues or questions) or call the office, otherwise you may reach out to schedule an office visit.  Thank you! Saddie Sacks, PA-C

## 2024-07-08 ENCOUNTER — Other Ambulatory Visit (HOSPITAL_COMMUNITY): Payer: Self-pay

## 2024-07-30 ENCOUNTER — Other Ambulatory Visit (HOSPITAL_COMMUNITY): Payer: Self-pay

## 2024-07-30 ENCOUNTER — Other Ambulatory Visit: Payer: Self-pay | Admitting: Family Medicine

## 2024-07-30 DIAGNOSIS — G43E19 Chronic migraine with aura, intractable, without status migrainosus: Secondary | ICD-10-CM

## 2024-08-18 ENCOUNTER — Encounter: Payer: Self-pay | Admitting: Family Medicine

## 2024-08-21 ENCOUNTER — Other Ambulatory Visit: Payer: Self-pay | Admitting: Family Medicine

## 2024-08-21 ENCOUNTER — Other Ambulatory Visit (HOSPITAL_COMMUNITY): Payer: Self-pay

## 2024-08-21 MED ORDER — ZEPBOUND 12.5 MG/0.5ML ~~LOC~~ SOAJ
12.5000 mg | SUBCUTANEOUS | 2 refills | Status: AC
  Filled 2024-08-21: qty 2, 28d supply, fill #0
  Filled 2024-11-04: qty 2, 28d supply, fill #1
  Filled 2024-11-25 – 2024-11-28 (×3): qty 2, 28d supply, fill #2

## 2024-08-29 ENCOUNTER — Other Ambulatory Visit: Payer: Self-pay

## 2024-08-29 ENCOUNTER — Other Ambulatory Visit (HOSPITAL_COMMUNITY): Payer: Self-pay

## 2024-08-29 DIAGNOSIS — F411 Generalized anxiety disorder: Secondary | ICD-10-CM

## 2024-09-01 NOTE — Telephone Encounter (Signed)
 PDMP reviewed, no aberrancies. Refill appropriate on alprazolam  1 mg PRN

## 2024-09-22 ENCOUNTER — Other Ambulatory Visit (HOSPITAL_COMMUNITY)
Admission: RE | Admit: 2024-09-22 | Discharge: 2024-09-22 | Disposition: A | Discharge status: Home / Self Care | Source: Ambulatory Visit | Attending: Nurse Practitioner | Admitting: Nurse Practitioner

## 2024-09-22 ENCOUNTER — Other Ambulatory Visit: Payer: Self-pay

## 2024-09-22 ENCOUNTER — Ambulatory Visit (INDEPENDENT_AMBULATORY_CARE_PROVIDER_SITE_OTHER): Admitting: Nurse Practitioner

## 2024-09-22 ENCOUNTER — Encounter: Payer: Self-pay | Admitting: Nurse Practitioner

## 2024-09-22 VITALS — BP 108/72 | HR 82 | Ht 59.25 in | Wt 176.0 lb

## 2024-09-22 DIAGNOSIS — Z124 Encounter for screening for malignant neoplasm of cervix: Secondary | ICD-10-CM | POA: Diagnosis not present

## 2024-09-22 DIAGNOSIS — Z1331 Encounter for screening for depression: Secondary | ICD-10-CM

## 2024-09-22 DIAGNOSIS — Z01419 Encounter for gynecological examination (general) (routine) without abnormal findings: Secondary | ICD-10-CM | POA: Insufficient documentation

## 2024-09-22 DIAGNOSIS — Z3041 Encounter for surveillance of contraceptive pills: Secondary | ICD-10-CM

## 2024-09-22 DIAGNOSIS — F411 Generalized anxiety disorder: Secondary | ICD-10-CM

## 2024-09-22 DIAGNOSIS — Z1211 Encounter for screening for malignant neoplasm of colon: Secondary | ICD-10-CM

## 2024-09-22 MED ORDER — BLISOVI FE 1/20 1-20 MG-MCG PO TABS: 1.0000 | ORAL_TABLET | Freq: Every day | ORAL | 3 refills | Status: AC

## 2024-09-22 NOTE — Progress Notes (Signed)
 Phyllis Morales 09/13/1976 [id number]   History:  48 y.o. G1P1001 presents for annual exam. COCs continuously. 2004 LEEP. GAD, migraines managed by PCP.   Gynecologic History No LMP recorded. (Menstrual status: Oral contraceptives).   Contraception/Family planning: OCP (estrogen/progesterone ) Sexually active: Yes  Health Maintenance Last Pap: 01/17/2022. Results were: Normal neg HPV, 3-year repeat Last mammogram: 2025. Results were: Normal per patient Last colonoscopy: Never Last Dexa: Not indicated     09/22/2024    3:11 PM  Depression screen PHQ 2/9  Decreased Interest 0  Down, Depressed, Hopeless 0  PHQ - 2 Score 0     Past medical history, past surgical history, family history and social history were all reviewed and documented in the EPIC chart. Married. 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 50 yo son, married, lives local , 4 mo daughter.   ROS:  A ROS was performed and pertinent positives and negatives are included.  Exam:  Vitals:   09/22/24 1451  BP: 108/72  Pulse: 82  SpO2: 97%  Weight: 176 lb (79.8 kg)  Height: 4' 11.25 (1.505 m)     Body mass index is 35.25 kg/m.  General appearance:  Normal Thyroid :  Symmetrical, normal in size, without palpable masses or nodularity. Respiratory  Auscultation:  Clear without wheezing or rhonchi Cardiovascular  Auscultation:  Regular rate, without rubs, murmurs or gallops  Edema/varicosities:  Not grossly evident Abdominal  Soft,nontender, without masses, guarding or rebound.  Liver/spleen:  No organomegaly noted  Hernia:  None appreciated  Skin  Inspection:  Grossly normal Breasts: Examined lying and sitting.   Right: Without masses, retractions, nipple discharge or axillary adenopathy.   Left: Without masses, retractions, nipple discharge or axillary adenopathy. Pelvic: External genitalia:  no lesions              Urethra:  normal appearing urethra with no masses, tenderness or lesions               Bartholins and Skenes: normal                 Vagina: normal appearing vagina with normal color and discharge, no lesions              Cervix: no lesions Bimanual Exam:  Uterus:  no masses or tenderness              Adnexa: no mass, fullness, tenderness              Rectovaginal: Deferred              Anus:  normal, no lesions  Phyllis Morales, CMA present as chaperone.   Assessment/Plan:  48 y.o. G1P1001 for annual exam.   Well female exam with routine gynecological exam - Education provided on SBEs, importance of preventative screenings, current guidelines, high calcium diet, regular exercise, and multivitamin daily.  Labs with PCP.   Encounter for initial prescription of contraceptive pills - Plan: norethindrone -ethinyl estradiol -FE (JUNEL  FE 1/20) 1-20 MG-MCG tablet continuously. Taking as prescribed. Refill x 1 year provided.   Screening for colon cancer - Plan: Cologuard. Cologuard versus colonoscopy discussed. No personal risk factors or family history of colon cancer. Interested in [employer].   Cervical cancer screening - Plan: Cytology - PAP( [place]). 2004 LEEP.  Pap today per guidelines.   Screening for breast cancer - Normal mammogram history.  Continue annual screenings. Normal breast exam today.  Return in about 1 year (around 09/22/2025) for Annual.     Phyllis Morales  DELENA Shutter DNP, 3:27 PM 09/22/2024

## 2024-09-23 ENCOUNTER — Ambulatory Visit: Payer: Self-pay | Admitting: Nurse Practitioner

## 2024-09-23 LAB — CYTOLOGY - PAP
Adequacy: ABSENT
Comment: NEGATIVE
Diagnosis: NEGATIVE
High risk HPV: NEGATIVE

## 2024-10-01 ENCOUNTER — Other Ambulatory Visit: Payer: Self-pay | Admitting: Family Medicine

## 2024-10-01 DIAGNOSIS — F411 Generalized anxiety disorder: Secondary | ICD-10-CM

## 2024-10-03 ENCOUNTER — Encounter: Payer: Self-pay | Admitting: Nurse Practitioner

## 2024-11-04 ENCOUNTER — Other Ambulatory Visit: Payer: Self-pay | Admitting: Family Medicine

## 2024-11-04 ENCOUNTER — Other Ambulatory Visit: Payer: Self-pay

## 2024-11-04 ENCOUNTER — Other Ambulatory Visit (HOSPITAL_COMMUNITY): Payer: Self-pay

## 2024-11-04 DIAGNOSIS — G43E19 Chronic migraine with aura, intractable, without status migrainosus: Secondary | ICD-10-CM

## 2024-11-04 DIAGNOSIS — G4700 Insomnia, unspecified: Secondary | ICD-10-CM

## 2024-11-10 ENCOUNTER — Ambulatory Visit: Payer: Self-pay | Admitting: *Deleted

## 2024-11-10 NOTE — Telephone Encounter (Signed)
 FYI Only or Action Required?: FYI only for provider: appointment scheduled on 12/31.  Patient was last seen in primary care on 06/26/2024 by Phyllis Saddie FALCON, PA-C.  Called Nurse Triage reporting Abdominal Pain.  Symptoms began a week ago.  Interventions attempted: OTC medications: acid reducers.  Symptoms are: unchanged.  Triage Disposition: See Physician Within 24 Hours  Patient/caregiver understands and will follow disposition?: No, refuses disposition  Patient declines 24 hours appointment due to work- she has scheduled Wednesday appointment with advisement- to call back if she gets worse- do not wait.    Copied from CRM #[id number]. Topic: Clinical - Red Word Triage >> Nov 10, 2024 11:15 AM [name] wrote: Red Word that prompted transfer to Nurse Triage: Patient experiencing stomach pain and severe heart burn. Reason for Disposition  [1] MODERATE pain (e.g., interferes with normal activities) AND [2] pain comes and goes (cramps) AND [3] present > 24 hours  (Exception: Pain with Vomiting or Diarrhea - see that Guideline.)  Answer Assessment - Initial Assessment Questions 1. LOCATION: Where does it hurt?      LLQ 2. RADIATION: Does the pain shoot anywhere else? (e.g., chest, back)     1-2 days all across abdomen- but mostly LLQ 3. ONSET: When did the pain begin? (e.g., minutes, hours or days ago)      1 week 4. SUDDEN: Gradual or sudden onset?     sudden 5. PATTERN Does the pain come and go, or is it constant?     Comes and goes 6. SEVERITY: How bad is the pain?  (e.g., Scale 1-10; mild, moderate, or severe)     Can be severe to mild- no pain now 7. RECURRENT SYMPTOM: Have you ever had this type of stomach pain before? If Yes, ask: When was the last time? and What happened that time?      no 8. CAUSE: What do you think is causing the stomach pain? (e.g., gallstones, recent abdominal surgery)     unsure 9. RELIEVING/AGGRAVATING FACTORS: What makes it better  or worse? (e.g., antacids, bending or twisting motion, bowel movement)     Heartburn medication - acid reducer- helps the most 10. OTHER SYMPTOMS: Do you have any other symptoms? (e.g., back pain, diarrhea, fever, urination pain, vomiting)       Back pain, some vomiting with reflux  Protocols used: Abdominal Pain - Female-A-AH

## 2024-11-12 ENCOUNTER — Ambulatory Visit (INDEPENDENT_AMBULATORY_CARE_PROVIDER_SITE_OTHER): Admitting: Family Medicine

## 2024-11-12 ENCOUNTER — Encounter: Payer: Self-pay | Admitting: Family Medicine

## 2024-11-12 VITALS — BP 122/89 | HR 87 | Ht 59.0 in | Wt 174.0 lb

## 2024-11-12 DIAGNOSIS — Z6836 Body mass index (BMI) 36.0-36.9, adult: Secondary | ICD-10-CM | POA: Diagnosis not present

## 2024-11-12 DIAGNOSIS — K219 Gastro-esophageal reflux disease without esophagitis: Secondary | ICD-10-CM | POA: Diagnosis not present

## 2024-11-12 DIAGNOSIS — R1032 Left lower quadrant pain: Secondary | ICD-10-CM | POA: Diagnosis not present

## 2024-11-12 DIAGNOSIS — Z1212 Encounter for screening for malignant neoplasm of rectum: Secondary | ICD-10-CM | POA: Diagnosis not present

## 2024-11-12 DIAGNOSIS — Z1211 Encounter for screening for malignant neoplasm of colon: Secondary | ICD-10-CM

## 2024-11-12 MED ORDER — PANTOPRAZOLE SODIUM 40 MG PO TBEC: DELAYED_RELEASE_TABLET | ORAL | 0 refills | Status: AC

## 2024-11-12 MED ORDER — PHENTERMINE HCL 30 MG PO CAPS: 30.0000 mg | ORAL_CAPSULE | ORAL | 1 refills | Status: AC

## 2024-11-12 NOTE — Progress Notes (Signed)
 "  Established Patient Office Visit  Subjective   Patient ID: Phyllis Morales, female    DOB: [birth year]  Age: 48 y.o. MRN: [id number]  Chief Complaint  Patient presents with   Back Pain   Abdominal Pain     History of Present Illness   Phyllis Morales is a 48 year old female who presents with severe abdominal and back pain.  She has been experiencing severe abdominal and back pain since last Monday. The pain was initially so intense that she did not eat on Monday or Tuesday, describing it as similar to menstrual cramps. She used a heating pad for relief. The pain improved on Wednesday and Thursday but worsened again on Friday, becoming severe enough that she could not get off the couch. The pain is localized to the left side of her abdomen and is sharp; she described experiencing sharp pain even while sitting and breathing.  She has a history of chronic back pain, which has remained constant. She also experiences severe heartburn that has been ongoing for months, sometimes leading to vomiting, which provides temporary relief. She has been taking a generic otc antacid, sometimes up to five or six pills,  but finds it ineffective. She has been on a ppi in the past for acid reflux, but it does not alleviate her symptoms.  She has been on Zepbound  for weight management for several months but has not experienced significant weight loss despite increased physical activity and dietary changes. She practices intermittent fasting, drinks water with lemon and ginger, and eats limited portions. She has not been using her weight plate recently due to her abdominal and back pain.  She reports having bowel movements multiple times a day, typically after eating. . No burning with urination.        The 10-year ASCVD risk score (Arnett DK, et al., 2019) is: 1.8%  Health Maintenance Due  Topic Date Due   Hepatitis B Vaccines 19-59 Average Risk (1 of 3 - 19+ 3-dose series) Never done   Fecal DNA  (Cologuard)  Never done   COVID-19 Vaccine (3 - 2025-26 season) 07/14/2024   Pneumococcal Vaccine: 50+ Years (1 of 1 - PCV) Never done   Zoster Vaccines- Shingrix (1 of 2) Never done      Objective:     BP 122/89   Pulse 87   Ht 4' 11 (1.499 m)   Wt 174 lb (78.9 kg)   SpO2 100%   BMI 35.14 kg/m    Physical Exam   Gen: alert, oriented Pulm: no respiratory distress ABDOMEN: Mild tenderness in the abdomen, with tenderness localized to the left lower quadrant.        No results found for any visits on 11/12/24.      Assessment & Plan:   Gastroesophageal reflux disease without esophagitis Assessment & Plan: Chronic GERD with severe heartburn, not controlled with current medication. Discussed potential structural causes and exacerbation risk with obesity treatment. - Refer to gastroenterologist for evaluation and possible endoscopy if appropriate.  (Also being sent for screening colonoscopy) - Prescribe pantoprazole  twice daily for one month, then once daily  Orders: -     Ambulatory referral to Gastroenterology  Encounter for colorectal cancer screening -     Ambulatory referral to Gastroenterology  BMI 36.0-36.9,adult Assessment & Plan: Persistent obesity despite lifestyle changes and Zepbound  12.5 mg. Discussed adding phentermine  and potential side effects, including exacerbation of acid reflux. Emphasized accurate calorie tracking. - Continue Zepbound  12.5 mg. -  Consider increasing Zepbound  to 15 mg if phentermine  is ineffective. - Start phentermine . - Use My Fitness Pal app for calorie and water tracking. - Follow up in one month to assess phentermine  effectiveness.   LLQ abdominal pain Assessment & Plan: Acute severe left lower quadrant pain with tenderness that resolved today prior to coming to appt.. Differential  is broad.  Not having bloody stools, diarrhea, or constipation.   - Monitor symptoms, consider ultrasound if pain recurs or worsens. - being  referred to GI for screening colonoscopy for CRC   Other orders -     Phentermine  HCl; Take 1 capsule (30 mg total) by mouth every morning.  Dispense: 30 capsule; Refill: 1 -     Pantoprazole  Sodium; Take 1 tablet twice a day for 30 days, then reduce to 1 tablet daily.  Dispense: 90 tablet; Refill: 0        Return in about 4 weeks (around 12/10/2024) for weight.  SABRA Toribio MARLA Chandra, MD  "

## 2024-11-12 NOTE — Patient Instructions (Addendum)
" °  VISIT SUMMARY: Today, we discussed your severe abdominal and back pain, persistent obesity, chronic heartburn, and chronic low back pain. We have made some changes to your medications and recommended further evaluations to better understand and manage your symptoms.  YOUR PLAN: OBESITY: Persistent obesity despite lifestyle changes and current medication. -Continue taking Zepbound  12.5 mg. -Start taking phentermine  as discussed. Be aware of potential side effects, including worsening acid reflux. -Consider increasing Zepbound  to 15 mg if phentermine  is not effective. -Use the My Fitness Pal app to track your calorie intake and water consumption. - be as accurate as possible with your tracking -Follow up in one month to assess the effectiveness of phentermine .  GASTROESOPHAGEAL REFLUX DISEASE (GERD): Chronic severe heartburn not controlled with current medication. -Refer to a gastroenterologist for evaluation and possible endoscopy. -Start taking pantoprazole twice daily for one month, then reduce to once daily.  LEFT LOWER QUADRANT ABDOMINAL PAIN: Acute severe pain in the left lower abdomen with tenderness. -Monitor your symptoms and consider an ultrasound if the pain recurs or worsens. -Refer to a gastroenterologist for further evaluation.  LOW BACK PAIN: Chronic low back pain, constant and persistent. -Continue with your current management plan and monitor your symptoms.  GENERAL HEALTH MAINTENANCE: Due for a colonoscopy, no previous procedure. -Refer to a gastroenterologist for a colonoscopy and possible endoscopy.    "

## 2024-11-14 ENCOUNTER — Telehealth: Payer: Self-pay | Admitting: Pharmacy Technician

## 2024-11-14 ENCOUNTER — Other Ambulatory Visit (HOSPITAL_COMMUNITY): Payer: Self-pay

## 2024-11-14 NOTE — Telephone Encounter (Signed)
 Pharmacy Patient Advocate Encounter  Received notification from [facility] that Prior Authorization for Pantoprazole Sodium 40MG  dr tablets has been APPROVED from 11/14/2024 to 11/14/2025. Ran test claim, Copay is $0.79. This test claim was processed through [facility]- copay amounts may vary at other pharmacies due to pharmacy/plan contracts, or as the patient moves through the different stages of their insurance plan.   PA #/Case ID/Reference #: [id number]

## 2024-11-14 NOTE — Telephone Encounter (Signed)
 Pharmacy Patient Advocate Encounter   Received notification from Onbase that prior authorization for Pantoprazole Sodium 40MG  dr tablets is required/requested.   Insurance verification completed.   The patient is insured through [facility].   Per test claim: PA required; PA submitted to above mentioned insurance via Latent Key/confirmation #/EOC [facility] Status is pending

## 2024-11-15 DIAGNOSIS — R1032 Left lower quadrant pain: Secondary | ICD-10-CM | POA: Insufficient documentation

## 2024-11-15 NOTE — Assessment & Plan Note (Addendum)
 Chronic GERD with severe heartburn, not controlled with current medication. Discussed potential structural causes and exacerbation risk with obesity treatment. - Refer to gastroenterologist for evaluation and possible endoscopy if appropriate.  (Also being sent for screening colonoscopy) - Prescribe pantoprazole  twice daily for one month, then once daily

## 2024-11-15 NOTE — Assessment & Plan Note (Signed)
 Acute severe left lower quadrant pain with tenderness that resolved today prior to coming to appt.. Differential  is broad.  Not having bloody stools, diarrhea, or constipation.   - Monitor symptoms, consider ultrasound if pain recurs or worsens. - being referred to GI for screening colonoscopy for CRC

## 2024-11-15 NOTE — Assessment & Plan Note (Signed)
 Persistent obesity despite lifestyle changes and Zepbound  12.5 mg. Discussed adding phentermine  and potential side effects, including exacerbation of acid reflux. Emphasized accurate calorie tracking. - Continue Zepbound  12.5 mg. - Consider increasing Zepbound  to 15 mg if phentermine  is ineffective. - Start phentermine . - Use My Fitness Pal app for calorie and water tracking. - Follow up in one month to assess phentermine  effectiveness.

## 2024-11-24 ENCOUNTER — Other Ambulatory Visit (HOSPITAL_COMMUNITY): Payer: Self-pay

## 2024-11-25 ENCOUNTER — Telehealth (HOSPITAL_COMMUNITY): Payer: Self-pay

## 2024-11-25 ENCOUNTER — Other Ambulatory Visit (HOSPITAL_COMMUNITY): Payer: Self-pay

## 2024-11-26 ENCOUNTER — Other Ambulatory Visit (HOSPITAL_COMMUNITY): Payer: Self-pay

## 2024-11-26 ENCOUNTER — Telehealth: Payer: Self-pay

## 2024-11-26 ENCOUNTER — Telehealth (HOSPITAL_COMMUNITY): Payer: Self-pay

## 2024-11-26 NOTE — Telephone Encounter (Signed)
 Pharmacy Patient Advocate Encounter  Received notification from [employer] that Prior Authorization for  Zepbound  12.5mg /0.80ml  has been DENIED.  See denial reason below. No denial letter attached in CMM. Will attach denial letter to Media tab once received.   PA #/Case ID/Reference #: [id number]

## 2024-11-26 NOTE — Telephone Encounter (Unsigned)
 Copied from CRM [id number]. Topic: Clinical - Medication Prior Auth >> Nov 26, 2024 10:58 AM Tiffini S wrote: Reason for CRM: Patient called stating that PA is needed for tirzepatide  (ZEPBOUND ) 12.5 MG/0.5ML Pen Please call the patient back with an update at [phone]

## 2024-11-26 NOTE — Telephone Encounter (Signed)
 PA request has been Received. New Encounter has been or will be created for follow up. For additional info see Pharmacy Prior Auth telephone encounter from 11/26/24.

## 2024-11-26 NOTE — Telephone Encounter (Signed)
 error

## 2024-11-26 NOTE — Telephone Encounter (Signed)
 Pharmacy Patient Advocate Encounter   Received notification from [employer] that prior authorization for Zepbound  12.5mg /0.39ml is required/requested.   Insurance verification completed.   The patient is insured through [facility].   Per test claim: PA required; PA submitted to above mentioned insurance via Latent Key/confirmation #/EOC [id number] Status is pending

## 2024-11-28 ENCOUNTER — Other Ambulatory Visit (HOSPITAL_COMMUNITY): Payer: Self-pay

## 2024-11-28 NOTE — Telephone Encounter (Signed)
 Pharmacy Patient Advocate Encounter  Received notification from [employer] that Prior Authorization appeal for Zepbound  12.mg/49ml has been DENIED.  Full denial letter will be uploaded to the media tab. See denial reason below.   PA #/Case ID/Reference #: [id number]

## 2024-12-05 ENCOUNTER — Other Ambulatory Visit: Payer: Self-pay

## 2024-12-05 NOTE — Progress Notes (Signed)
 To whom it may concern,  I am writing to formally appeal the denial of the coverage for Zepbound  12.5 mg for my patient, who has been maintained on this medication for approximately 3 years for the treatment of obesity with documented insulin  resistance.  Patient diagnosed with Insulin  Resistance in 2023 by Heather  Hanford, NP. At that time was started Wegovy . In Jan 2024, patient was having difficulty picking up Wegovy  due to supply issues and and she has been on Zepbound  ever since.  Patient has been on Zepbound  on 12/08/2022. She has been doing extremely well on this medication. Weight down 10 lbs. Her A1c with 6.4 while on GLP-1 therapy.   This medication was initiated after failure of lifestyle modification alone and has been a critical component of her long-term metabolic disease management.  During treatment with tirzepatide , the patient has demonstrated: - Sustained weight loss and weight stabilization: -Improved insulin  sensitivity - Reduction and cardiometabolic risk factors - Improved functional status and quality of life  Obesity and insulin  resistance are chronic relapsing diseases, not short-term conditions.  Discontinuation of effective pharmacotherapy in a patient who has demonstrated long-term benefit places her at high risk for weight regain, worsening insulin  resistance, and progression to type 2 diabetes, as well as associated cardiovascular and metabolic complications  Importantly, this request is not for initiation of new therapy, but rather for continuation of her previously approved and effective medication.  Abrupt loss of coverage contradicts evidence-based obesity management guidelines, which emphasized the necessity of ongoing treatment to maintain clinical benefit.  Given the patient's documented response, ongoing medical necessity, and the well-established chronic nature of obesity and insulin  resistance, I respectfully request reconsideration and approval for continued  coverage of Zepbound .  I appreciate your time and consideration into this case that ultimately affect the patient's long-term health and safety.    Sincerely, Saddie PHEBE Sacks, PA-C [facility]  [address] [place], [place] [address]  Telephone: [phone]  Fax: [phone]

## 2024-12-10 ENCOUNTER — Ambulatory Visit

## 2024-12-10 ENCOUNTER — Other Ambulatory Visit: Payer: Self-pay | Admitting: Family Medicine

## 2024-12-10 DIAGNOSIS — F411 Generalized anxiety disorder: Secondary | ICD-10-CM
